# Patient Record
Sex: Male | Born: 1965 | Race: White | Hispanic: No | Marital: Married | Smoking: Never smoker
Health system: Southern US, Community
[De-identification: ages and names within clinical notes are randomized; demographics above are authoritative.]

## PROBLEM LIST (undated history)

## (undated) DIAGNOSIS — N189 Chronic kidney disease, unspecified: Secondary | ICD-10-CM

## (undated) DIAGNOSIS — R011 Cardiac murmur, unspecified: Secondary | ICD-10-CM

## (undated) DIAGNOSIS — Z87442 Personal history of urinary calculi: Secondary | ICD-10-CM

## (undated) DIAGNOSIS — S42309A Unspecified fracture of shaft of humerus, unspecified arm, initial encounter for closed fracture: Secondary | ICD-10-CM

## (undated) DIAGNOSIS — E785 Hyperlipidemia, unspecified: Secondary | ICD-10-CM

## (undated) DIAGNOSIS — T7840XA Allergy, unspecified, initial encounter: Secondary | ICD-10-CM

## (undated) HISTORY — PX: HERNIA REPAIR: SHX51

## (undated) HISTORY — DX: Hyperlipidemia, unspecified: E78.5

## (undated) HISTORY — DX: Cardiac murmur, unspecified: R01.1

## (undated) HISTORY — DX: Allergy, unspecified, initial encounter: T78.40XA

## (undated) HISTORY — DX: Unspecified fracture of shaft of humerus, unspecified arm, initial encounter for closed fracture: S42.309A

---

## 2010-07-25 ENCOUNTER — Emergency Department (HOSPITAL_COMMUNITY): Admission: EM | Admit: 2010-07-25 | Discharge: 2010-07-26 | Payer: Self-pay | Admitting: Emergency Medicine

## 2010-07-25 ENCOUNTER — Encounter: Admission: RE | Admit: 2010-07-25 | Discharge: 2010-07-25 | Payer: Self-pay | Admitting: Family Medicine

## 2011-02-06 LAB — URINALYSIS, ROUTINE W REFLEX MICROSCOPIC
Glucose, UA: NEGATIVE mg/dL
Leukocytes, UA: NEGATIVE
Nitrite: NEGATIVE
Specific Gravity, Urine: 1.021 (ref 1.005–1.030)
pH: 5.5 (ref 5.0–8.0)

## 2011-02-06 LAB — BASIC METABOLIC PANEL
BUN: 15 mg/dL (ref 6–23)
Calcium: 9.5 mg/dL (ref 8.4–10.5)
Creatinine, Ser: 0.94 mg/dL (ref 0.4–1.5)
GFR calc non Af Amer: >60 mL/min (ref >60)
Glucose, Bld: 104 mg/dL — ABNORMAL HIGH (ref 70–99)

## 2011-02-06 LAB — CBC
HCT: 43.6 % (ref 39.0–52.0)
MCHC: 34.6 g/dL (ref 30.0–36.0)
Platelets: 380 10*3/uL (ref 150–400)
RDW: 12.7 % (ref 11.5–15.5)
WBC: 8 10*3/uL (ref 4.0–10.5)

## 2011-02-06 LAB — URINE MICROSCOPIC-ADD ON

## 2012-01-31 ENCOUNTER — Ambulatory Visit (INDEPENDENT_AMBULATORY_CARE_PROVIDER_SITE_OTHER): Payer: Managed Care, Other (non HMO) | Admitting: Family Medicine

## 2012-01-31 DIAGNOSIS — N200 Calculus of kidney: Secondary | ICD-10-CM

## 2012-01-31 DIAGNOSIS — R319 Hematuria, unspecified: Secondary | ICD-10-CM

## 2012-01-31 LAB — POCT UA - MICROSCOPIC ONLY
Bacteria, U Microscopic: NEGATIVE
Casts, Ur, LPF, POC: NEGATIVE
Crystals, Ur, HPF, POC: NEGATIVE
Epithelial cells, urine per micros: NEGATIVE
Mucus, UA: NEGATIVE
Yeast, UA: NEGATIVE

## 2012-01-31 LAB — POCT URINALYSIS DIPSTICK
Bilirubin, UA: NEGATIVE
Glucose, UA: NEGATIVE
Ketones, UA: NEGATIVE
Nitrite, UA: NEGATIVE
Protein, UA: 30
Spec Grav, UA: 1.015
Urobilinogen, UA: 0.2
pH, UA: 5.5

## 2012-01-31 MED ORDER — TAMSULOSIN HCL 0.4 MG PO CAPS: 0.4000 mg | ORAL_CAPSULE | Freq: Every day | ORAL | Status: DC

## 2012-01-31 NOTE — Progress Notes (Signed)
  Subjective:    Patient ID: Victor Nixon, male    DOB: [birth year], 46 y.o.   MRN: [id number]  Abdominal Pain This is a chronic problem. The current episode started more than 1 month ago. The onset quality is gradual. The problem occurs intermittently. The problem has been unchanged. The pain is located in the left flank. The pain is at a severity of 3/10. The pain is mild. The quality of the pain is aching and a sensation of fullness. The abdominal pain does not radiate. The pain is aggravated by nothing. The pain is relieved by nothing. He has tried nothing for the symptoms. The treatment provided no relief.      Review of Systems  Gastrointestinal: Positive for abdominal pain.  All other systems reviewed and are negative.       Objective:   Physical Exam  Constitutional: He is oriented to person, place, and time. He appears well-developed and well-nourished. No distress.  HENT:  Head: Normocephalic and atraumatic.  Eyes: EOM are normal. Pupils are equal, round, and reactive to light.  Neck: Normal range of motion. Neck supple.  Cardiovascular: Normal rate and regular rhythm.   Pulmonary/Chest: Effort normal and breath sounds normal.  Abdominal: Soft.  Musculoskeletal: Normal range of motion.  Neurological: He is alert and oriented to person, place, and time.  Skin: Skin is warm and dry. He is not diaphoretic.  Psychiatric: He has a normal mood and affect. His behavior is normal. Judgment and thought content normal.   Results for orders placed in visit on 01/31/12  POCT URINALYSIS DIPSTICK      Component Value Range   Color, UA yellow     Clarity, UA clear     Glucose, UA negative     Bilirubin, UA negative     Ketones, UA negative     Spec Grav, UA 1.015     Blood, UA large     pH, UA 5.5     Protein, UA 30     Urobilinogen, UA 0.2     Nitrite, UA negative     Leukocytes, UA Trace    POCT UA - MICROSCOPIC ONLY      Component Value Range   WBC, Ur, HPF, POC 1-4       RBC, urine, microscopic TNTC     Bacteria, U Microscopic negative     Mucus, UA negative     Epithelial cells, urine per micros negative     Crystals, Ur, HPF, POC negative     Casts, Ur, LPF, POC negative     Yeast, UA negative            Assessment & Plan:  Kidney stone  P:  Drink plenty, Flomax 0.4 daily, call INB in 48 hrs.

## 2012-01-31 NOTE — Patient Instructions (Signed)

## 2012-05-12 ENCOUNTER — Ambulatory Visit (INDEPENDENT_AMBULATORY_CARE_PROVIDER_SITE_OTHER): Payer: Managed Care, Other (non HMO) | Admitting: Family Medicine

## 2012-05-12 VITALS — BP 110/71 | HR 67 | Temp 97.9°F | Resp 16 | Ht 70.0 in | Wt 179.6 lb

## 2012-05-12 DIAGNOSIS — R109 Unspecified abdominal pain: Secondary | ICD-10-CM

## 2012-05-12 LAB — POCT URINALYSIS DIPSTICK
Glucose, UA: NEGATIVE
Ketones, UA: NEGATIVE
Spec Grav, UA: 1.01
Urobilinogen, UA: 0.2

## 2012-05-12 LAB — POCT CBC
Hemoglobin: 15.6 g/dL (ref 14.1–18.1)
Lymph, poc: 1.9 (ref 0.6–3.4)
MCH, POC: 30.6 pg (ref 27–31.2)
MCV: 90.4 fL (ref 80–97)
MID (cbc): 0.5 (ref 0–0.9)
Platelet Count, POC: 450 10*3/uL — AB (ref 142–424)
RBC: 5.09 M/uL (ref 4.69–6.13)
WBC: 5.8 10*3/uL (ref 4.6–10.2)

## 2012-05-12 LAB — COMPREHENSIVE METABOLIC PANEL
BUN: 13 mg/dL (ref 6–23)
CO2: 26 mEq/L (ref 19–32)
Calcium: 9.8 mg/dL (ref 8.4–10.5)
Chloride: 101 mEq/L (ref 96–112)
Creat: 0.83 mg/dL (ref 0.50–1.35)

## 2012-05-12 LAB — POCT UA - MICROSCOPIC ONLY: Mucus, UA: NEGATIVE

## 2012-05-12 NOTE — Progress Notes (Signed)
Patient Name: Victor Nixon Date of Birth: Jun 20, 1966 Medical Record Number: [id number] Gender: male Date of Encounter: 05/12/2012  History of Present Illness:  Victor Nixon is a 46 y.o. very pleasant male patient who presents with the following:  He has had problems  LLQ abdominal pain since late 2012.  He was here in march and diagnosed with possible kidney stone- treated with flomax.  The flomax did help, but he has continued to have fluctuating pain which is sometimes an "ache" and sometimes can be more painful.  It does not hurt every day.   No hematuria, no BRBPR or melena.  No weight loss, no nausea or vomiting.   He does not feel that his urine flows as well as it should.    There is no problem list on file for this patient.  No past medical history on file. No past surgical history on file. History  Substance Use Topics  . Smoking status: Never Smoker   . Smokeless tobacco: Not on file  . Alcohol Use: Not on file   No family history on file. Allergies  Allergen Reactions  . Cephalexin     Medication list has been reviewed and updated.  Prior to Admission medications   Medication Sig Start Date End Date Taking? Authorizing Provider  Tamsulosin HCl (FLOMAX) 0.4 MG CAPS Take 1 capsule (0.4 mg total) by mouth daily. 01/31/12  Yes Elvina Sidle, MD    Review of Systems:  As per HPI- otherwise negative.   Physical Examination: Filed Vitals:   05/12/12 0745  BP: 110/71  Pulse: 67  Temp: 97.9 F (36.6 C)  Resp: 16   Filed Vitals:   05/12/12 0745  Height: 5\' 10"  (1.778 m)  Weight: 179 lb 9.6 oz (81.466 kg)   Body mass index is 25.77 kg/(m^2).Ideal Body Weight: Weight in (lb) to have BMI = 25: 173.9   GEN: WDWN, NAD, Non-toxic, A & O x 3 HEENT: Atraumatic, Normocephalic. Neck supple. No masses, No LAD. Ears and Nose: No external deformity. CV: RRR, No M/G/R. No JVD. No thrill. No extra heart sounds. PULM: CTA B, no wheezes, crackles, rhonchi. No  retractions. No resp. distress. No accessory muscle use. ABD: S, ND, +BS. No rebound. No HSM.  Minimal tenderness LLQ GU: no sign of inguinal hernia, normal genitalia, no testicular masses EXTR: No c/c/e NEURO Normal gait.  PSYCH: Normally interactive. Conversant. Not depressed or anxious appearing.  Calm demeanor.   Results for orders placed in visit on 05/12/12  POCT CBC      Component Value Range   WBC 5.8  4.6 - 10.2 K/uL   Lymph, poc 1.9  0.6 - 3.4   POC LYMPH PERCENT 33.0  10 - 50 %L   MID (cbc) 0.5  0 - 0.9   POC MID % 8.6  0 - 12 %M   POC Granulocyte 3.4  2 - 6.9   Granulocyte percent 58.4  37 - 80 %G   RBC 5.09  4.69 - 6.13 M/uL   Hemoglobin 15.6  14.1 - 18.1 g/dL   HCT, POC 78.2  95.6 - 53.7 %   MCV 90.4  80 - 97 fL   MCH, POC 30.6  27 - 31.2 pg   MCHC 33.9  31.8 - 35.4 g/dL   RDW, POC 21.3     Platelet Count, POC 450 (*) 142 - 424 K/uL   MPV 8.7  0 - 99.8 fL  POCT UA - MICROSCOPIC ONLY  Component Value Range   WBC, Ur, HPF, POC 6-8     RBC, urine, microscopic 0-1     Bacteria, U Microscopic trace     Mucus, UA negative     Epithelial cells, urine per micros 0-1     Crystals, Ur, HPF, POC negative     Casts, Ur, LPF, POC negative     Yeast, UA negative     Renal tubular cells positive    POCT URINALYSIS DIPSTICK      Component Value Range   Color, UA yellow     Clarity, UA clear     Glucose, UA negative     Bilirubin, UA negative     Ketones, UA negative     Spec Grav, UA 1.010     Blood, UA trace-intact     pH, UA 6.5     Protein, UA negative     Urobilinogen, UA 0.2     Nitrite, UA negative     Leukocytes, UA small (1+)      Assessment and Plan: 1. Abdominal pain  POCT CBC, Comprehensive metabolic panel, POCT UA - Microscopic Only, POCT urinalysis dipstick, US Abdomen Complete, Urine culture   Chronic abdominal pain of some months duration.  Await other labs and set up abdominal ultrasound.  Ade is hesitant to do a CT, but is willing to go  ahead with U/S which is a reasonable first step.  Will check urine culture due to wbc in urine.  Otherwise POC labs are reassuring.  He will let me know if he gets worse or if symptoms change in the mantime- otherwise follow- up pending his labs and ultrasound.    Abbe Amsterdam, MD

## 2012-05-13 ENCOUNTER — Encounter: Payer: Self-pay | Admitting: Family Medicine

## 2012-05-14 LAB — URINE CULTURE
Colony Count: NO GROWTH
Organism ID, Bacteria: NO GROWTH

## 2012-05-17 ENCOUNTER — Ambulatory Visit
Admission: RE | Admit: 2012-05-17 | Discharge: 2012-05-17 | Disposition: A | Discharge status: Home / Self Care | Payer: Managed Care, Other (non HMO) | Source: Ambulatory Visit | Attending: Family Medicine | Admitting: Family Medicine

## 2012-05-17 ENCOUNTER — Other Ambulatory Visit: Payer: Self-pay | Admitting: Family Medicine

## 2012-05-17 DIAGNOSIS — N1330 Unspecified hydronephrosis: Secondary | ICD-10-CM

## 2012-05-17 DIAGNOSIS — R109 Unspecified abdominal pain: Secondary | ICD-10-CM

## 2012-05-18 ENCOUNTER — Telehealth: Payer: Self-pay

## 2012-05-18 NOTE — Telephone Encounter (Signed)
Patient called wanting some clarity on his CT orders. States that Copland ordered it w/o contrast but when he made his appt he was told that it would be with contrast. His appt is at 10:45 am on 05/19/12 and he does not want to drink that "nasty stuff" if he doesn't have to. Please clarify and/or correct with [facility].

## 2012-05-19 ENCOUNTER — Telehealth: Payer: Self-pay

## 2012-05-19 ENCOUNTER — Ambulatory Visit
Admission: RE | Admit: 2012-05-19 | Discharge: 2012-05-19 | Disposition: A | Discharge status: Home / Self Care | Payer: Managed Care, Other (non HMO) | Source: Ambulatory Visit | Attending: Family Medicine | Admitting: Family Medicine

## 2012-05-19 DIAGNOSIS — N1330 Unspecified hydronephrosis: Secondary | ICD-10-CM

## 2012-05-19 NOTE — Telephone Encounter (Signed)
Urgent  DR.  COPLAND   Pt was told he needed to drink the contrast before his 11 am appt.  He said you did not want the contrast.  Please call [phone]

## 2012-05-19 NOTE — Telephone Encounter (Signed)
See notes under 6/26 phone message.

## 2012-05-19 NOTE — Telephone Encounter (Signed)
Pt is calling he just had a ct done and would like for someone to contact him when the results are in.

## 2012-05-19 NOTE — Telephone Encounter (Signed)
Spoke w/pt and let him know that (per Tamara's report) Dr Patsy Lager called [facility] this AM and instr'd them that she wants CT w/o contrast. [facility] stated that we had to put the referral order back in and state in the order that the test is only to r/o kidney stones. Delaney Meigs put in order this AM again w/this add'l note. Advised pt that [facility] should be able to see the new order in their computer but if he has any trouble when he arrives to have them call us. Pt agreed and thanked [place].

## 2012-05-19 NOTE — Addendum Note (Signed)
Addended by: Cydney Ok on: 05/19/2012 08:50 AM   Modules accepted: Orders

## 2012-05-19 NOTE — Telephone Encounter (Signed)
Done- we are setting him up to see urology

## 2012-05-20 ENCOUNTER — Other Ambulatory Visit: Payer: Self-pay | Admitting: Urology

## 2012-05-20 ENCOUNTER — Telehealth: Payer: Self-pay

## 2012-05-20 DIAGNOSIS — N200 Calculus of kidney: Secondary | ICD-10-CM

## 2012-05-20 NOTE — Telephone Encounter (Signed)
Dr  Patsy Lager   PT HAS VISITED [facility] AND NOW HAS SOME QUESTIONS FOR [name] MAY REACH PT AT [phone]

## 2012-05-21 NOTE — Telephone Encounter (Signed)
Called and LMOM- I would defer to the urologist's advice concerning stone management.  Shock wave therapy is only appropriate for some stones.  If I can be of further help please call, but my best advice would be to follow the urologist's recommendation.

## 2012-05-21 NOTE — Telephone Encounter (Signed)
CB pt and explained that Dr Patsy Lager is out of the office until 05/31/12 and asked if another provider can help him w/his ?s. Pt stated there is no real urgency, but would like to talk w/Dr Copland after her return if possible (he will be OOT between 6/7-6/15), and if another provider has any input bf that he would be glad to have that info. Urologist recommended a procedure where they insert a tube and go into kidney to retrieve the large kidney stone he has instead of the procedure that uses sound waves d/t size of stone. Pt just wants your input on the procedure, etc.

## 2012-06-09 ENCOUNTER — Encounter (HOSPITAL_COMMUNITY): Payer: Self-pay | Admitting: Pharmacy Technician

## 2012-06-15 ENCOUNTER — Encounter (HOSPITAL_COMMUNITY)
Admission: RE | Admit: 2012-06-15 | Discharge: 2012-06-15 | Disposition: A | Discharge status: Home / Self Care | Payer: Managed Care, Other (non HMO) | Source: Ambulatory Visit | Attending: Urology | Admitting: Urology

## 2012-06-15 ENCOUNTER — Encounter (HOSPITAL_COMMUNITY): Payer: Self-pay

## 2012-06-15 HISTORY — DX: Chronic kidney disease, unspecified: N18.9

## 2012-06-15 LAB — CBC
HCT: 42.3 % (ref 39.0–52.0)
Hemoglobin: 14.5 g/dL (ref 13.0–17.0)
MCH: 30.1 pg (ref 26.0–34.0)
MCHC: 34.3 g/dL (ref 30.0–36.0)
MCV: 87.9 fL (ref 78.0–100.0)
Platelets: 379 10*3/uL (ref 150–400)
RBC: 4.81 MIL/uL (ref 4.22–5.81)
RDW: 12.9 % (ref 11.5–15.5)
WBC: 5.9 10*3/uL (ref 4.0–10.5)

## 2012-06-15 LAB — SURGICAL PCR SCREEN
MRSA, PCR: NEGATIVE
Staphylococcus aureus: POSITIVE — AB

## 2012-06-15 NOTE — Patient Instructions (Signed)
20 Victor Nixon  06/15/2012   Your procedure is scheduled on:  Monday 06/21/2012 at 1030 am for surgery  Report to [facility] Radiology  at [facility] AM.  Call this number if you have problems the morning of surgery: [phone]   Remember:   Do not eat food:After Midnight.  May have clear liquids:until Midnight .  Marland Kitchen  Take these medicines the morning of surgery with A SIP OF WATER:NONE                         Do not wear jewlery  Do not wear lotions, powders, or perfumes.  Do not shave 48 hours prior to surgery. Men may shave face and neck.  Do not bring valuables to the hospital.  Contacts, dentures or bridgework may not be worn into surgery.  Leave suitcase in the car. After surgery it may be brought to your room.  For patients admitted to the hospital, checkout time is 11:00 AM the day of discharge.       Special Instructions: CHG Shower Use Special Wash: 1/2 bottle night before surgery and 1/2 bottle morning of surgery.   Please read over the following fact sheets that you were given: MRSA Information, Blood Transfusion sheet, Incentive Spirometry Sheet, Sleep apnea sheet                If you have any questions, please call me ar [phone] Telford Nab.Georgeanna Lea, Rn,BSN

## 2012-06-15 NOTE — Pre-Procedure Instructions (Signed)
Reviewed with patient pre-op instructions using the Teach back method.

## 2012-06-17 ENCOUNTER — Other Ambulatory Visit: Payer: Self-pay | Admitting: Radiology

## 2012-06-18 ENCOUNTER — Other Ambulatory Visit: Payer: Self-pay | Admitting: Family Medicine

## 2012-06-20 NOTE — H&P (Signed)
History of Present Illness        Left nephrolithiasis: The patient underwent a renal ultrasound on 05/17/12 for left lower quadrant pain and was found to have left-sided hydronephrosis. A followup CT scan done 2 days later revealed a 15 mm stone in the left renal pelvis with mild hydronephrosis noted. The stone was actually located at the ureteropelvic junction and I measured Hounsfield units of 1250. No right renal calculi were identified. I also reviewed a CT scan done on 07/25/10 which revealed a stone located at the same position measuring 5 mm in size and also causing mild dilatation of the collecting system. He thought he passed his stone then except what he describes sounds like it may have just been a small clot. A urine culture was found to be negative and a urinalysis prior to that was clear. The creatinine was noted to be normal at 0.83. He's been taking tamsulosin which he finds actually does help relieve some of the bloating type sensation that he has experienced.   Past Medical History Problems  1. History of  Hypercholesterolemia 272.0 2. History of  Murmurs 785.2  Surgical History Problems  1. History of  Hernia Repair  Current Meds 1. Fish Oil 1000 MG Oral Capsule; Therapy: (Recorded:27Jun2013) to 2. Flomax 0.4 MG Oral Capsule; Therapy: (Recorded:27Jun2013) to  Allergies Medication  1. Keflex TABS  Family History Problems  1. Family history of  No Significant Family History  Social History Problems    Alcohol Use   Caffeine Use   Marital History - Currently Married   Never A Smoker  Review of Systems Genitourinary, constitutional, skin, eye, otolaryngeal, hematologic/lymphatic, cardiovascular, pulmonary, endocrine, musculoskeletal, gastrointestinal, neurological and psychiatric system(s) were reviewed and pertinent findings if present are noted.  Gastrointestinal: abdominal pain.    Vitals Vital Signs  BMI Calculated: 25.77 BSA Calculated: 2 Height: 5 ft  10 in Weight: 180 lb  Blood Pressure: 102 / 64 Heart Rate: 64  Physical Exam Constitutional: Well nourished and well developed . No acute distress.  ENT:. The ears and nose are normal in appearance.  Neck: The appearance of the neck is normal and no neck mass is present.  Pulmonary: No respiratory distress and normal respiratory rhythm and effort.  Cardiovascular: Heart rate and rhythm are normal . No peripheral edema.  Abdomen: The abdomen is soft and nontender. No masses are palpated. No CVA tenderness. No hernias are palpable. No hepatosplenomegaly noted.  Lymphatics: The femoral and inguinal nodes are not enlarged or tender.  Skin: Normal skin turgor, no visible rash and no visible skin lesions.  Neuro/Psych:. Mood and affect are appropriate.    Results/Data Urine COLOR STRAW   APPEARANCE CLEAR   SPECIFIC GRAVITY 1.010   pH 5.0   GLUCOSE NEG mg/dL  BILIRUBIN NEG   KETONE NEG mg/dL  BLOOD NEG   PROTEIN NEG mg/dL  UROBILINOGEN 0.2 mg/dL  NITRITE NEG   LEUKOCYTE ESTERASE TRACE   SQUAMOUS EPITHELIAL/HPF NONE SEEN   WBC 0-2 WBC/hpf  RBC NONE SEEN RBC/hpf  BACTERIA NONE SEEN   CRYSTALS NONE SEEN   CASTS NONE SEEN    The following images/tracing/specimen were independently visualized:  CT scan as above.  The following clinical lab reports were reviewed:  Urinalysis and creatinine as above.  The following radiology reports were reviewed: CT scan.    Assessment Assessed  1. Nephrolithiasis Of The Left Kidney 592.0 2. Hydronephrosis On The Left 591   I have discussed with the patient  the fact that he has a large stone located ureteropelvic junction on the left-hand side with very high Hounsfield units. We discussed the treatment options and certainly due to the size of the stone he would not be a candidate for ureteroscopic management. Lithotripsy could be performed with the placement of a stent prior to the procedure however due to the stone size and density this would likely  require more than one treatment with lithotripsy. I therefore have recommended a percutaneous nephrolithotomy. I went over this procedure with him in detail and drew pictures to explain how the procedure was performed, we then discussed its risks and complications and probability of success as well as possible need for stent and nephrostomy tube postoperatively. He understands and has elected to proceed.   Plan    He is scheduled for a left percutaneous nephrolithotomy.

## 2012-06-20 NOTE — Anesthesia Preprocedure Evaluation (Addendum)
Anesthesia Evaluation  Patient identified by MRN, date of birth, ID band Patient awake    Reviewed: Allergy & Precautions, H&P , NPO status , Patient's Chart, lab work & pertinent test results  Airway Mallampati: II TM Distance: >3 FB Neck ROM: full    Dental No notable dental hx.    Pulmonary neg pulmonary ROS,  breath sounds clear to auscultation  Pulmonary exam normal       Cardiovascular Exercise Tolerance: Good negative cardio ROS  Rhythm:regular Rate:Normal     Neuro/Psych negative neurological ROS  negative psych ROS   GI/Hepatic negative GI ROS, Neg liver ROS,   Endo/Other  negative endocrine ROS  Renal/GU negative Renal ROS  negative genitourinary   Musculoskeletal   Abdominal   Peds  Hematology negative hematology ROS (+)   Anesthesia Other Findings   Reproductive/Obstetrics negative OB ROS                           Anesthesia Physical Anesthesia Plan  ASA: I  Anesthesia Plan: General   Post-op Pain Management:    Induction: Intravenous  Airway Management Planned: Oral ETT  Additional Equipment:   Intra-op Plan:   Post-operative Plan: Extubation in OR  Informed Consent: I have reviewed the patients History and Physical, chart, labs and discussed the procedure including the risks, benefits and alternatives for the proposed anesthesia with the patient or authorized representative who has indicated his/her understanding and acceptance.   Dental Advisory Given  Plan Discussed with: CRNA and Surgeon  Anesthesia Plan Comments:         Anesthesia Quick Evaluation  

## 2012-06-21 ENCOUNTER — Ambulatory Visit (HOSPITAL_COMMUNITY)
Admission: RE | Admit: 2012-06-21 | Discharge: 2012-06-22 | Disposition: A | Discharge status: Home / Self Care | Payer: Managed Care, Other (non HMO) | Source: Ambulatory Visit | Attending: Urology | Admitting: Urology

## 2012-06-21 ENCOUNTER — Ambulatory Visit (HOSPITAL_COMMUNITY): Payer: Managed Care, Other (non HMO) | Admitting: Anesthesiology

## 2012-06-21 ENCOUNTER — Encounter (HOSPITAL_COMMUNITY)
Admission: RE | Disposition: A | Discharge status: Home / Self Care | Payer: Self-pay | Source: Ambulatory Visit | Attending: Urology

## 2012-06-21 ENCOUNTER — Encounter (HOSPITAL_COMMUNITY): Payer: Self-pay | Admitting: Anesthesiology

## 2012-06-21 ENCOUNTER — Ambulatory Visit (HOSPITAL_COMMUNITY)
Admission: RE | Admit: 2012-06-21 | Discharge: 2012-06-21 | Disposition: A | Discharge status: Home / Self Care | Payer: Managed Care, Other (non HMO) | Source: Ambulatory Visit | Attending: Urology | Admitting: Urology

## 2012-06-21 ENCOUNTER — Other Ambulatory Visit: Payer: Self-pay | Admitting: Urology

## 2012-06-21 ENCOUNTER — Encounter (HOSPITAL_COMMUNITY): Payer: Self-pay

## 2012-06-21 ENCOUNTER — Ambulatory Visit (HOSPITAL_COMMUNITY): Payer: Managed Care, Other (non HMO)

## 2012-06-21 VITALS — BP 127/86 | HR 81 | Temp 98.5°F | Resp 16 | Ht 70.0 in | Wt 185.0 lb

## 2012-06-21 DIAGNOSIS — Z01812 Encounter for preprocedural laboratory examination: Secondary | ICD-10-CM | POA: Insufficient documentation

## 2012-06-21 DIAGNOSIS — E7800 Pure hypercholesterolemia, unspecified: Secondary | ICD-10-CM | POA: Insufficient documentation

## 2012-06-21 DIAGNOSIS — N200 Calculus of kidney: Secondary | ICD-10-CM

## 2012-06-21 DIAGNOSIS — N1330 Unspecified hydronephrosis: Secondary | ICD-10-CM | POA: Insufficient documentation

## 2012-06-21 DIAGNOSIS — Z79899 Other long term (current) drug therapy: Secondary | ICD-10-CM | POA: Insufficient documentation

## 2012-06-21 DIAGNOSIS — R011 Cardiac murmur, unspecified: Secondary | ICD-10-CM | POA: Insufficient documentation

## 2012-06-21 HISTORY — PX: NEPHROLITHOTOMY: SHX5134

## 2012-06-21 LAB — CBC WITH DIFFERENTIAL/PLATELET
Basophils Absolute: 0.1 10*3/uL (ref 0.0–0.1)
Basophils Relative: 1 % (ref 0–1)
Eosinophils Relative: 3 % (ref 0–5)
HCT: 44.1 % (ref 39.0–52.0)
MCH: 30.6 pg (ref 26.0–34.0)
MCHC: 34.9 g/dL (ref 30.0–36.0)
MCV: 87.5 fL (ref 78.0–100.0)
Monocytes Absolute: 0.7 10*3/uL (ref 0.1–1.0)
RDW: 12.7 % (ref 11.5–15.5)

## 2012-06-21 LAB — ABO/RH: ABO/RH(D): O POS

## 2012-06-21 LAB — TYPE AND SCREEN
ABO/RH(D): O POS
Antibody Screen: NEGATIVE

## 2012-06-21 LAB — BASIC METABOLIC PANEL
BUN: 12 mg/dL (ref 6–23)
CO2: 27 mEq/L (ref 19–32)
Chloride: 100 mEq/L (ref 96–112)
Creatinine, Ser: 0.84 mg/dL (ref 0.50–1.35)

## 2012-06-21 SURGERY — NEPHROLITHOTOMY PERCUTANEOUS
Anesthesia: General | Laterality: Left | Wound class: Clean Contaminated

## 2012-06-21 MED ORDER — IOHEXOL 300 MG/ML  SOLN: 30.0000 mL | Freq: Once | INTRAMUSCULAR | Status: DC | PRN

## 2012-06-21 MED ORDER — IOHEXOL 300 MG/ML  SOLN
INTRAMUSCULAR | Status: DC | PRN
  Administered 2012-06-21: 10 mL

## 2012-06-21 MED ORDER — CIPROFLOXACIN IN D5W 400 MG/200ML IV SOLN: 400.0000 mg | Freq: Once | INTRAVENOUS | Status: DC

## 2012-06-21 MED ORDER — ONDANSETRON HCL 4 MG/2ML IJ SOLN: 4.0000 mg | INTRAMUSCULAR | Status: DC | PRN

## 2012-06-21 MED ORDER — MIDAZOLAM HCL 5 MG/5ML IJ SOLN
INTRAMUSCULAR | Status: AC | PRN
  Administered 2012-06-21 (×5): 1 mg via INTRAVENOUS

## 2012-06-21 MED ORDER — FENTANYL CITRATE 0.05 MG/ML IJ SOLN
INTRAMUSCULAR | Status: AC
  Filled 2012-06-21: qty 4

## 2012-06-21 MED ORDER — HYOSCYAMINE SULFATE 0.125 MG SL SUBL
0.1250 mg | SUBLINGUAL_TABLET | SUBLINGUAL | Status: DC | PRN
  Filled 2012-06-21: qty 1

## 2012-06-21 MED ORDER — CISATRACURIUM BESYLATE (PF) 10 MG/5ML IV SOLN
INTRAVENOUS | Status: DC | PRN
  Administered 2012-06-21: 10 mg via INTRAVENOUS

## 2012-06-21 MED ORDER — LACTATED RINGERS IV SOLN: INTRAVENOUS | Status: DC

## 2012-06-21 MED ORDER — MEPERIDINE HCL 50 MG/ML IJ SOLN
INTRAMUSCULAR | Status: AC
  Filled 2012-06-21: qty 1

## 2012-06-21 MED ORDER — HYDROMORPHONE HCL PF 1 MG/ML IJ SOLN
INTRAMUSCULAR | Status: AC
  Filled 2012-06-21: qty 1

## 2012-06-21 MED ORDER — SODIUM CHLORIDE 0.9 % IV SOLN
Freq: Once | INTRAVENOUS | Status: AC
  Administered 2012-06-21: 09:00:00 via INTRAVENOUS

## 2012-06-21 MED ORDER — ZOLPIDEM TARTRATE 5 MG PO TABS: 5.0000 mg | ORAL_TABLET | Freq: Every evening | ORAL | Status: DC | PRN

## 2012-06-21 MED ORDER — FENTANYL CITRATE 0.05 MG/ML IJ SOLN
INTRAMUSCULAR | Status: AC
  Filled 2012-06-21: qty 2

## 2012-06-21 MED ORDER — SODIUM CHLORIDE 0.9 % IV SOLN
INTRAVENOUS | Status: DC
  Administered 2012-06-21: 14:00:00 via INTRAVENOUS

## 2012-06-21 MED ORDER — PROPOFOL 10 MG/ML IV EMUL
INTRAVENOUS | Status: DC | PRN
  Administered 2012-06-21: 150 mg via INTRAVENOUS

## 2012-06-21 MED ORDER — ACETAMINOPHEN 10 MG/ML IV SOLN
1000.0000 mg | Freq: Four times a day (QID) | INTRAVENOUS | Status: AC
  Administered 2012-06-21 – 2012-06-22 (×3): 1000 mg via INTRAVENOUS
  Filled 2012-06-21 (×3): qty 100

## 2012-06-21 MED ORDER — GLYCOPYRROLATE 0.2 MG/ML IJ SOLN
INTRAMUSCULAR | Status: DC | PRN
  Administered 2012-06-21: .8 mg via INTRAVENOUS

## 2012-06-21 MED ORDER — MIDAZOLAM HCL 2 MG/2ML IJ SOLN
INTRAMUSCULAR | Status: AC
  Filled 2012-06-21: qty 2

## 2012-06-21 MED ORDER — HYDROMORPHONE HCL PF 1 MG/ML IJ SOLN: 0.5000 mg | INTRAMUSCULAR | Status: DC | PRN

## 2012-06-21 MED ORDER — MEPERIDINE HCL 50 MG/ML IJ SOLN
12.5000 mg | INTRAMUSCULAR | Status: AC | PRN
  Administered 2012-06-21 (×2): 12.5 mg via INTRAVENOUS

## 2012-06-21 MED ORDER — ONDANSETRON HCL 4 MG/2ML IJ SOLN
INTRAMUSCULAR | Status: DC | PRN
  Administered 2012-06-21: 4 mg via INTRAVENOUS

## 2012-06-21 MED ORDER — OXYCODONE-ACETAMINOPHEN 5-325 MG PO TABS
1.0000 | ORAL_TABLET | ORAL | Status: DC | PRN
  Administered 2012-06-22: 1 via ORAL
  Filled 2012-06-21: qty 1

## 2012-06-21 MED ORDER — SODIUM CHLORIDE 0.9 % IR SOLN
Status: DC | PRN
  Administered 2012-06-21: 6000 mL

## 2012-06-21 MED ORDER — DEXAMETHASONE SODIUM PHOSPHATE 10 MG/ML IJ SOLN
INTRAMUSCULAR | Status: DC | PRN
  Administered 2012-06-21: 10 mg via INTRAVENOUS

## 2012-06-21 MED ORDER — FENTANYL CITRATE 0.05 MG/ML IJ SOLN
INTRAMUSCULAR | Status: DC | PRN
  Administered 2012-06-21: 50 ug via INTRAVENOUS

## 2012-06-21 MED ORDER — NEOSTIGMINE METHYLSULFATE 1 MG/ML IJ SOLN
INTRAMUSCULAR | Status: DC | PRN
  Administered 2012-06-21: 5 mg via INTRAVENOUS

## 2012-06-21 MED ORDER — MIDAZOLAM HCL 2 MG/2ML IJ SOLN
INTRAMUSCULAR | Status: AC
  Filled 2012-06-21: qty 4

## 2012-06-21 MED ORDER — CIPROFLOXACIN IN D5W 400 MG/200ML IV SOLN
400.0000 mg | INTRAVENOUS | Status: AC
  Administered 2012-06-21: 400 mg via INTRAVENOUS
  Filled 2012-06-21: qty 200

## 2012-06-21 MED ORDER — HYDROMORPHONE HCL PF 1 MG/ML IJ SOLN
0.2500 mg | INTRAMUSCULAR | Status: DC | PRN
  Administered 2012-06-21 (×2): 0.5 mg via INTRAVENOUS

## 2012-06-21 MED ORDER — IOHEXOL 300 MG/ML  SOLN
INTRAMUSCULAR | Status: AC
  Filled 2012-06-21: qty 2

## 2012-06-21 MED ORDER — ACETAMINOPHEN 10 MG/ML IV SOLN
1000.0000 mg | Freq: Four times a day (QID) | INTRAVENOUS | Status: DC
  Administered 2012-06-21: 1000 mg via INTRAVENOUS
  Filled 2012-06-21 (×3): qty 100

## 2012-06-21 MED ORDER — PHENAZOPYRIDINE HCL 200 MG PO TABS
200.0000 mg | ORAL_TABLET | Freq: Three times a day (TID) | ORAL | Status: DC
  Administered 2012-06-21 – 2012-06-22 (×2): 200 mg via ORAL
  Filled 2012-06-21 (×5): qty 1

## 2012-06-21 MED ORDER — LACTATED RINGERS IV SOLN
INTRAVENOUS | Status: DC | PRN
  Administered 2012-06-21 (×2): via INTRAVENOUS

## 2012-06-21 MED ORDER — FENTANYL CITRATE 0.05 MG/ML IJ SOLN
INTRAMUSCULAR | Status: AC | PRN
  Administered 2012-06-21 (×2): 50 ug via INTRAVENOUS
  Administered 2012-06-21: 100 ug via INTRAVENOUS
  Administered 2012-06-21: 50 ug via INTRAVENOUS

## 2012-06-21 MED ORDER — CIPROFLOXACIN IN D5W 200 MG/100ML IV SOLN
200.0000 mg | Freq: Two times a day (BID) | INTRAVENOUS | Status: DC
  Administered 2012-06-21 – 2012-06-22 (×2): 200 mg via INTRAVENOUS
  Filled 2012-06-21 (×4): qty 100

## 2012-06-21 MED ORDER — LIDOCAINE HCL 1 % IJ SOLN
INTRAMUSCULAR | Status: AC
  Filled 2012-06-21: qty 20

## 2012-06-21 SURGICAL SUPPLY — 48 items
APPLICATOR SURGIFLO ENDO (HEMOSTASIS) ×2 IMPLANT
BAG URINE DRAINAGE (UROLOGICAL SUPPLIES) IMPLANT
BASKET ZERO TIP NITINOL 2.4FR (BASKET) IMPLANT
BENZOIN TINCTURE PRP APPL 2/3 (GAUZE/BANDAGES/DRESSINGS) ×2 IMPLANT
BLADE SURG 15 STRL LF DISP TIS (BLADE) ×1 IMPLANT
BLADE SURG 15 STRL SS (BLADE) ×1
CATH FOLEY 2W COUNCIL 20FR 5CC (CATHETERS) IMPLANT
CATH FOLEY 2WAY SLVR  5CC 18FR (CATHETERS) ×1
CATH FOLEY 2WAY SLVR 5CC 18FR (CATHETERS) ×1 IMPLANT
CATH ROBINSON RED A/P 20FR (CATHETERS) IMPLANT
CATH X-FORCE N30 NEPHROSTOMY (TUBING) ×2 IMPLANT
CLOTH BEACON ORANGE TIMEOUT ST (SAFETY) ×2 IMPLANT
COVER SURGICAL LIGHT HANDLE (MISCELLANEOUS) ×2 IMPLANT
DRAPE C-ARM 42X72 X-RAY (DRAPES) ×2 IMPLANT
DRAPE CAMERA CLOSED 9X96 (DRAPES) IMPLANT
DRAPE LINGEMAN PERC (DRAPES) ×2 IMPLANT
DRAPE SURG IRRIG POUCH 19X23 (DRAPES) ×2 IMPLANT
DRSG PAD ABDOMINAL 8X10 ST (GAUZE/BANDAGES/DRESSINGS) ×4 IMPLANT
DRSG TEGADERM 8X12 (GAUZE/BANDAGES/DRESSINGS) ×4 IMPLANT
FLOSEAL 10ML (HEMOSTASIS) ×2 IMPLANT
GAUZE SPONGE 4X4 16PLY XRAY LF (GAUZE/BANDAGES/DRESSINGS) IMPLANT
GLOVE BIOGEL M 8.0 STRL (GLOVE) ×6 IMPLANT
GOWN STRL REIN XL XLG (GOWN DISPOSABLE) ×6 IMPLANT
GUIDEWIRE STR DUAL SENSOR (WIRE) IMPLANT
HOLDER FOLEY CATH W/STRAP (MISCELLANEOUS) IMPLANT
JUMPSUIT BLUE BOOT COVER DISP (PROTECTIVE WEAR) ×4 IMPLANT
KIT BASIN OR (CUSTOM PROCEDURE TRAY) ×2 IMPLANT
LASER FIBER DISP (UROLOGICAL SUPPLIES) IMPLANT
LASER FIBER DISP 1000U (UROLOGICAL SUPPLIES) IMPLANT
MANIFOLD NEPTUNE II (INSTRUMENTS) ×2 IMPLANT
NS IRRIG 1000ML POUR BTL (IV SOLUTION) ×2 IMPLANT
PACK BASIC VI WITH GOWN DISP (CUSTOM PROCEDURE TRAY) ×2 IMPLANT
PROBE LITHOCLAST ULTRA 3.8X403 (UROLOGICAL SUPPLIES) ×2 IMPLANT
PROBE PNEUMATIC 1.0MMX570MM (UROLOGICAL SUPPLIES) IMPLANT
SET IRRIG Y TYPE TUR BLADDER L (SET/KITS/TRAYS/PACK) ×2 IMPLANT
SET WARMING FLUID IRRIGATION (MISCELLANEOUS) IMPLANT
SPONGE GAUZE 4X4 12PLY (GAUZE/BANDAGES/DRESSINGS) IMPLANT
SPONGE LAP 4X18 X RAY DECT (DISPOSABLE) ×2 IMPLANT
STENT CONTOUR 6FRX24X.038 (STENTS) ×2 IMPLANT
STONE CATCHER W/TUBE ADAPTER (UROLOGICAL SUPPLIES) ×2 IMPLANT
SUT MNCRL AB 4-0 PS2 18 (SUTURE) ×2 IMPLANT
SUT SILK 2 0 30  PSL (SUTURE) ×1
SUT SILK 2 0 30 PSL (SUTURE) ×1 IMPLANT
SYR 20CC LL (SYRINGE) ×4 IMPLANT
SYRINGE 10CC LL (SYRINGE) ×2 IMPLANT
TOWEL OR NON WOVEN STRL DISP B (DISPOSABLE) ×2 IMPLANT
TRAY FOLEY CATH 14FRSI W/METER (CATHETERS) ×2 IMPLANT
TUBING CONNECTING 10 (TUBING) ×4 IMPLANT

## 2012-06-21 NOTE — Op Note (Signed)
PATIENT:  Victor Nixon  PRE-OPERATIVE DIAGNOSIS:  Left renal calculus.  POST-OPERATIVE DIAGNOSIS:  Same  PROCEDURE:  Procedure(s): 1. Right percutaneous nephrolithotomy (1.5 cm) 2. Antegrade right double-J stent placement  SURGEON:  [name]  INDICATION: Victor Nixon is a 46 year old male who was found to have left hydronephrosis on ultrasound done for left lower quadrant pain. A CT scan was done which revealed a 15 mm stone located at the ureteropelvic junction with mild hydronephrosis associated. He had Hounsfield units of 1250. I note the same stone was present on CT scan done in 9/11 at that time it measured 5 mm in size. We discussed the treatment options and due to the large size of the stone as well as the fact that it had been present for several years I recommended a percutaneous approach.  ANESTHESIA:  General  EBL:  Minimal  DRAINS: 6 French, 24 cm double-J stent in the left ureter  LOCAL MEDICATIONS USED:  None  SPECIMEN:  Stone  DISPOSITION OF SPECIMEN:  Given the patient  Description of procedure: After informed consent the patient was taken to the major OR. General anesthesia was and administered and he was moved to the prone position. His left flank including the previously placed nephrostomy catheter were sterilely prepped and draped. An official timeout was then performed.  I passed a 0.038 inch floppy-tipped guidewire through the previously placed nephrostomy catheter down into the bladder and remove the catheter. I then made a transverse incision over the guidewire and passed the coaxial catheter over the guidewire down the ureter under fluoroscopic control. A second guidewire was then passed through the inner portion of the coaxial system down into the bladder and used as a working guidewire with the second guidewire used as a safety guidewire. The UroMax fascial dilating balloon was then passed over the guidewire and into the area of the renal pelvis which was  identified by the location of the stone and his previous nephrostogram images. The balloon was dilated and the 30 French access sheath was passed over the balloon into the area of the renal pelvis. The balloon was then deflated and removed.  The 10 French rigid nephroscope was then passed under direct vision into the area the renal pelvis. The stone was easily identified at the ureteropelvic junction. I first grasped with 2 prong graspers and was able to gently worked the stone free from the ureteropelvic junction. I was unable to withdraw the stone through the sheath and therefore elected to proceed with ultrasonic fragmentation. The ultrasound probe was then passed through the rigid nephroscope and the stone was fragmented. I grasped the fragments with the 2 prong grasper and remove these. Reinspection revealed no further stone fragments within the kidney. The renal pelvis and UPJ region were noted to be intact. There was very little bleeding seen at any time during the case and as I backed the access sheath back to the level of the renal parenchyma no increase in bleeding was noted. I therefore elected to proceed with with a "tubeless" approach.  I backloaded the nephroscope over the guidewire and passed the double-J stent down the guidewire and into the bladder. As I removed the guidewire good curl was noted in the bladder as well as renal pelvis. I then grasped the stent with 2 prong graspers and held in position while the safety guidewire was removed. I then measured the distance from the edge of the renal parenchyma to the top of the access sheath and used  FloSeal to fill the nephrostomy tract by passing a laparoscopic introducer through the access sheath the measured distance to place it at the edge of the renal parenchyma and then began injecting as I withdrew the nephrostomy sheath. I then closed the skin with running subcuticular 4-0 Monocryl suture. A sterile 4 x 4 and OpSite dressing were then  applied and the patient was awakened and taken to the recovery room in stable and satisfactory condition. He tolerated the procedure well no intraoperative complications.   PLAN OF CARE:  H will be observed overnight with anticipated discharge in the morning.   PATIENT DISPOSITION:  PACU - hemodynamically stable.

## 2012-06-21 NOTE — Procedures (Signed)
Interventional Radiology Procedure Note  Procedure: LEFT percutaneous nephrostomy with placement of a 5F catheter into a posterior, lower pole calyx, past the UPJ stone and into the bladder. Complications: No immediate Recommendations: - To OR for PCNL  Signed,  Sterling Big, MD Vascular & Interventional Radiologist [facility]

## 2012-06-21 NOTE — H&P (Signed)
Agree with above.  Will Proceed with LEFT percutaneous nephrostomy placement under imaging guidance.  Percutaneous access can then be used for PCNL today.  Signed,  Sterling Big, MD Vascular & Interventional Radiologist [facility]iology

## 2012-06-21 NOTE — Progress Notes (Signed)
Mr. Turnage has no significant complaints. He does have some slight flank soreness. Otherwise he seems to be doing well. He is tolerating his Foley catheter. He is having some slight irritative symptoms from his stent.  Vital signs are stable.  His abdomen is soft and nontender. His flank reveals no ecchymose with an intact dressing. The Foley catheter is draining slightly pink urine with no clots but is nearly clear at this time.  Impression: Status post left percutaneous nephrolithotomy. He is doing well postoperatively.  Plan: Per orders with Foley catheter removal in the morning and anticipated discharge at that time.

## 2012-06-21 NOTE — Anesthesia Postprocedure Evaluation (Signed)
  Anesthesia Post-op Note  Patient: Victor Nixon  Procedure(s) Performed: Procedure(s) (LRB): NEPHROLITHOTOMY PERCUTANEOUS (Left)  Patient Location: PACU  Anesthesia Type: General  Level of Consciousness: awake and alert   Airway and Oxygen Therapy: Patient Spontanous Breathing  Post-op Pain: mild  Post-op Assessment: Post-op Vital signs reviewed, Patient's Cardiovascular Status Stable, Respiratory Function Stable, Patent Airway and No signs of Nausea or vomiting  Post-op Vital Signs: stable  Complications: No apparent anesthesia complications

## 2012-06-21 NOTE — Transfer of Care (Signed)
Immediate Anesthesia Transfer of Care Note  Patient: Victor Nixon  Procedure(s) Performed: Procedure(s) (LRB): NEPHROLITHOTOMY PERCUTANEOUS (Left)  Patient Location: PACU  Anesthesia Type: General  Level of Consciousness: awake, sedated and patient cooperative  Airway & Oxygen Therapy: Patient Spontanous Breathing and Patient connected to face mask oxygen  Post-op Assessment: Report given to PACU RN and Post -op Vital signs reviewed and stable  Post vital signs: Reviewed and stable  Complications: No apparent anesthesia complications

## 2012-06-21 NOTE — Interval H&P Note (Signed)
History and Physical Interval Note:  06/21/2012 10:21 AM  Victor Nixon  has presented today for surgery, with the diagnosis of left ureteropelvic junction stone  The various methods of treatment have been discussed with the patient and family. After consideration of risks, benefits and other options for treatment, the patient has consented to  Procedure(s) (LRB): NEPHROLITHOTOMY PERCUTANEOUS (Left) as a surgical intervention .  The patient's history has been reviewed, patient examined, no change in status, stable for surgery.  I have reviewed the patient's chart and labs.  Questions were answered to the patient's satisfaction.     [name]

## 2012-06-21 NOTE — H&P (Signed)
Victor Nixon is an 46 y.o. male.   Chief Complaint: left renal stone with hydronephrosis. Patient presents today for left percutaneous nephrostomy placement pre-litho procedure.    HPI: see urology  Note below : Victor Farm, MD Physician Signed Urology H&P 06/20/2012 3:49 PM  Related encounter: Pre-admit from 06/21/2012 in [facility]    History of Present Illness        Left nephrolithiasis: The patient underwent a renal ultrasound on 05/17/12 for left lower quadrant pain and was found to have left-sided hydronephrosis. A followup CT scan done 2 days later revealed a 15 mm stone in the left renal pelvis with mild hydronephrosis noted. The stone was actually located at the ureteropelvic junction and I measured Hounsfield units of 1250. No right renal calculi were identified. I also reviewed a CT scan done on 07/25/10 which revealed a stone located at the same position measuring 5 mm in size and also causing mild dilatation of the collecting system. He thought he passed his stone then except what he describes sounds like it may have just been a small clot. A urine culture was found to be negative and a urinalysis prior to that was clear. The creatinine was noted to be normal at 0.83. He's been taking tamsulosin which he finds actually does help relieve some of the bloating type sensation that he has experienced.   Past Medical History Problems  1. History of  Hypercholesterolemia 272.0 2. History of  Murmurs 785.2   Surgical History Problems  1. History of  Hernia Repair   Current Meds 1. Fish Oil 1000 MG Oral Capsule; Therapy: (Recorded:27Jun2013) to 2. Flomax 0.4 MG Oral Capsule; Therapy: (Recorded:27Jun2013) to   Allergies Medication  1. Keflex TABS   Family History Problems  1. Family history of  No Significant Family History   Social History Problems    Alcohol Use   Caffeine Use   Marital History - Currently Married   Never A Smoker   Review of  Systems Genitourinary, constitutional, skin, eye, otolaryngeal, hematologic/lymphatic, cardiovascular, pulmonary, endocrine, musculoskeletal, gastrointestinal, neurological and psychiatric system(s) were reviewed and pertinent findings if present are noted.  Gastrointestinal: abdominal pain.     Vitals Vital Signs  BMI Calculated: 25.77 BSA Calculated: 2 Height: 5 ft 10 in Weight: 180 lb   Blood Pressure: 102 / 64 Heart Rate: 64   Physical Exam Constitutional: Well nourished and well developed . No acute distress.  ENT:. The ears and nose are normal in appearance.  Neck: The appearance of the neck is normal and no neck mass is present.  Pulmonary: No respiratory distress and normal respiratory rhythm and effort.  Cardiovascular: Heart rate and rhythm are normal . No peripheral edema.  Abdomen: The abdomen is soft and nontender. No masses are palpated. No CVA tenderness. No hernias are palpable. No hepatosplenomegaly noted.  Lymphatics: The femoral and inguinal nodes are not enlarged or tender.  Skin: Normal skin turgor, no visible rash and no visible skin lesions.  Neuro/Psych:. Mood and affect are appropriate.     Results/Data Urine COLOR  STRAW    APPEARANCE  CLEAR    SPECIFIC GRAVITY  1.010    pH  5.0    GLUCOSE  NEG mg/dL   BILIRUBIN  NEG    KETONE  NEG mg/dL   BLOOD  NEG    PROTEIN  NEG mg/dL   UROBILINOGEN  0.2 mg/dL   NITRITE  NEG    LEUKOCYTE ESTERASE  TRACE  SQUAMOUS EPITHELIAL/HPF  NONE SEEN    WBC  0-2 WBC/hpf   RBC  NONE SEEN RBC/hpf   BACTERIA  NONE SEEN    CRYSTALS  NONE SEEN    CASTS  NONE SEEN      The following images/tracing/specimen were independently visualized:  CT scan as above.  The following clinical lab reports were reviewed:  Urinalysis and creatinine as above.  The following radiology reports were reviewed: CT scan.     Assessment Assessed  1. Nephrolithiasis Of The Left Kidney 592.0 2. Hydronephrosis On The Left 591    I have  discussed with the patient the fact that he has a large stone located ureteropelvic junction on the left-hand side with very high Hounsfield units. We discussed the treatment options and certainly due to the size of the stone he would not be a candidate for ureteroscopic management. Lithotripsy could be performed with the placement of a stent prior to the procedure however due to the stone size and density this would likely require more than one treatment with lithotripsy. I therefore have recommended a percutaneous nephrolithotomy. I went over this procedure with him in detail and drew pictures to explain how the procedure was performed, we then discussed its risks and complications and probability of success as well as possible need for stent and nephrostomy tube postoperatively. He understands and has elected to proceed.    Plan      He is scheduled for a left percutaneous nephrolithotomy.   Examination for today's procedure :   Past Medical History  Diagnosis Date  . Chronic kidney disease     stones    Past Surgical History  Procedure Date  . Hernia repair     age 34 or 8    Social History:  reports that he has never smoked. He does not have any smokeless tobacco history on file. He reports that he drinks alcohol. He reports that he does not use illicit drugs. Married, spouse present.   Allergies:  Allergies  Allergen Reactions  . Cephalexin Rash    Results for orders placed during the hospital encounter of 06/21/12 (from the past 48 hour(s))  TYPE AND SCREEN     Status: Normal   Collection Time   06/21/12  7:45 AM      Component Value Range Comment   ABO/RH(D) O POS      Antibody Screen NEG      Sample Expiration 06/24/2012     APTT     Status: Normal   Collection Time   06/21/12  7:50 AM      Component Value Range Comment   aPTT 33  24 - 37 seconds   BASIC METABOLIC PANEL     Status: Abnormal   Collection Time   06/21/12  7:50 AM      Component Value Range Comment    Sodium 138  135 - 145 mEq/L    Potassium 3.4 (*) 3.5 - 5.1 mEq/L    Chloride 100  96 - 112 mEq/L    CO2 27  19 - 32 mEq/L    Glucose, Bld 113 (*) 70 - 99 mg/dL    BUN 12  6 - 23 mg/dL    Creatinine, Ser 1.61  0.50 - 1.35 mg/dL    Calcium 9.4  8.4 - 09.6 mg/dL    GFR calc non Af Amer >90  >90 mL/min    GFR calc Af Amer >90  >90 mL/min   CBC WITH DIFFERENTIAL  Status: Normal   Collection Time   06/21/12  7:50 AM      Component Value Range Comment   WBC 6.3  4.0 - 10.5 K/uL    RBC 5.04  4.22 - 5.81 MIL/uL    Hemoglobin 15.4  13.0 - 17.0 g/dL    HCT 57.8  46.9 - 62.9 %    MCV 87.5  78.0 - 100.0 fL    MCH 30.6  26.0 - 34.0 pg    MCHC 34.9  30.0 - 36.0 g/dL    RDW 52.8  41.3 - 24.4 %    Platelets 400  150 - 400 K/uL    Neutrophils Relative 55  43 - 77 %    Neutro Abs 3.4  1.7 - 7.7 K/uL    Lymphocytes Relative 31  12 - 46 %    Lymphs Abs 1.9  0.7 - 4.0 K/uL    Monocytes Relative 11  3 - 12 %    Monocytes Absolute 0.7  0.1 - 1.0 K/uL    Eosinophils Relative 3  0 - 5 %    Eosinophils Absolute 0.2  0.0 - 0.7 K/uL    Basophils Relative 1  0 - 1 %    Basophils Absolute 0.1  0.0 - 0.1 K/uL   PROTIME-INR     Status: Normal   Collection Time   06/21/12  7:50 AM      Component Value Range Comment   Prothrombin Time 13.5  11.6 - 15.2 seconds    INR 1.01  0.00 - 1.49     Review of Systems  Constitutional: Negative for fever, chills, weight loss and malaise/fatigue.  Respiratory: Negative.   Cardiovascular: Negative.   Gastrointestinal: Negative.   Genitourinary: Positive for hematuria and flank pain. Negative for dysuria.  Musculoskeletal: Negative.   Skin: Negative.   Endo/Heme/Allergies: Negative.   Psychiatric/Behavioral: Negative.     Blood pressure 134/78, pulse 64, temperature 98.5 F (36.9 C), temperature source Oral, resp. rate 16, height 5\' 10"  (1.778 m), weight 185 lb (83.915 kg), SpO2 100.00%. Physical Exam  Constitutional: He is oriented to person, place, and time.  He appears well-developed and well-nourished. No distress.  HENT:  Head: Normocephalic and atraumatic.  Eyes: Pupils are equal, round, and reactive to light.  Cardiovascular: Normal rate, regular rhythm and normal heart sounds.  Exam reveals no gallop and no friction rub.   No murmur heard. Respiratory: Effort normal and breath sounds normal. No respiratory distress. He has no wheezes. He has no rales.  GI: Soft. Bowel sounds are normal.  Musculoskeletal: Normal range of motion. He exhibits no edema.  Neurological: He is alert and oriented to person, place, and time.  Skin: Skin is warm and dry.  Psychiatric: He has a normal mood and affect. His behavior is normal. Judgment and thought content normal.     Assessment/Plan Procedure details for percutaneous nephrostomy drain placement discussed in detail with patient and spouse with their apparent understanding. Potential complications including but not limited to infection, bleeding, organ damage, and complications with drain placement and moderate sedation discussed and all his questions answered to his satisfaction. Labs reviewed and are WNL to proceed. Written consent obtained.    CAMPBELL,PAMELA D 06/21/2012, 8:42 AM

## 2012-06-22 ENCOUNTER — Encounter (HOSPITAL_COMMUNITY): Payer: Self-pay | Admitting: Urology

## 2012-06-22 MED ORDER — DIAZEPAM 10 MG PO TABS: ORAL_TABLET | ORAL | Status: DC

## 2012-06-22 MED ORDER — HYDROCODONE-ACETAMINOPHEN 10-325 MG PO TABS: 1.0000 | ORAL_TABLET | ORAL | Status: AC | PRN

## 2012-06-22 MED ORDER — URIBEL 118 MG PO CAPS: 1.0000 | ORAL_CAPSULE | Freq: Four times a day (QID) | ORAL | Status: DC | PRN

## 2012-06-22 NOTE — Progress Notes (Addendum)
Patient was assisted to get out of bed with a staff member.The patient and staff member ambulated a short distance in the hallway. The patient returned to the bed.

## 2012-06-22 NOTE — Progress Notes (Signed)
Assessment unchanged.  Pt given scripts per MD order.  D/C instructions were given and D/C papers were signed.  All additional questions/concerns were addressed and answered.  Pt D/C'd via wheelchair and accompanied by NT.

## 2012-06-24 LAB — GLUCOSE, CAPILLARY: Glucose-Capillary: 134 mg/dL — ABNORMAL HIGH (ref 70–99)

## 2012-06-29 ENCOUNTER — Encounter: Payer: Self-pay | Admitting: Family Medicine

## 2012-09-02 IMAGING — XA IR INTRO URET CATH PERC *L*
2 series · 10 of 10 positions shown · non-contrast
Comparison: CT of the abdomen and pelvis - 05/19/2012

FLUOROSCOPIC GUIDANCE FOR PUNCTURE OF THE LEFT RENAL COLLECTING
SYSTEM,
LEFT PERCUTANEOUS NEPHROSTOMY TUBE PLACEMENT

Date: 06/21/12
CLINICAL HISTORY: Renal stones, access for left percutaneous
nephrolithotomy.
Procedures Performed:
   1.    Percutaneous puncture of the renal collecting system under
      fluoroscopic guidance
2.    Placement of a percutaneous nephrostomy tube

[Series 1: care single · 1 of 1 slices shown]
[im 1/1]
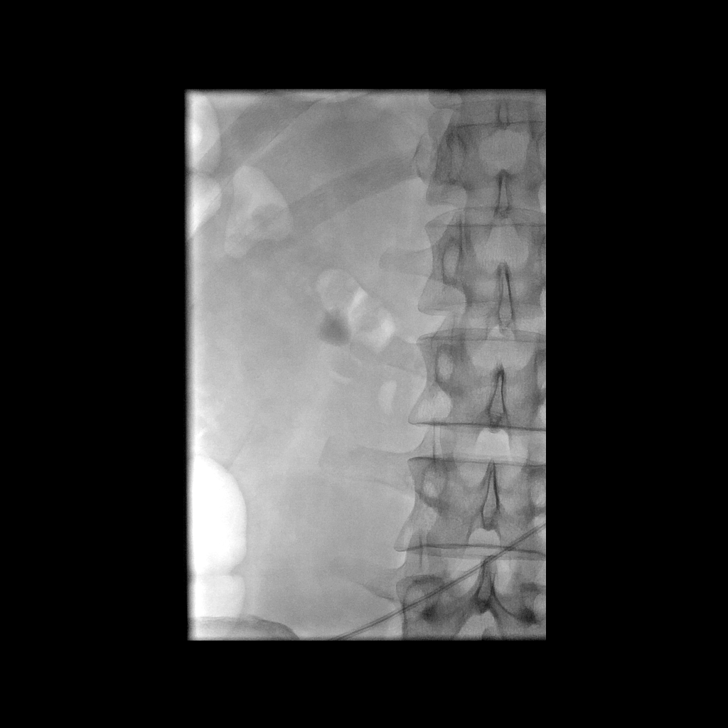

[Series 300: tube placements · 9 of 9 slices shown]
[im 1/9]
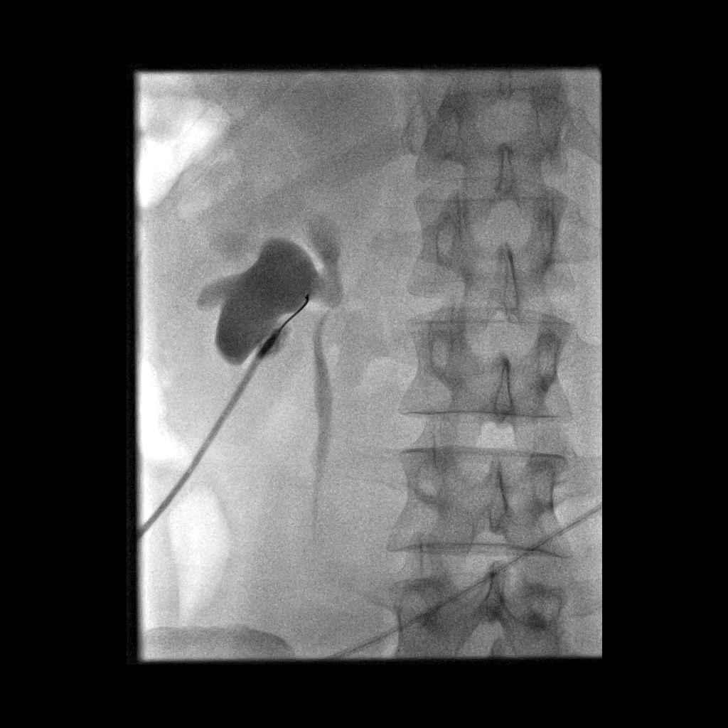
[im 2/9]
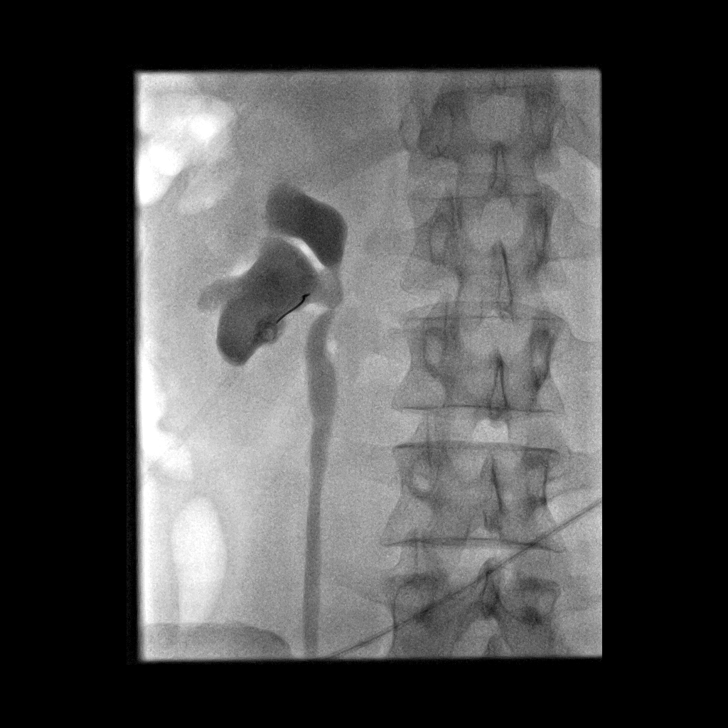
[im 3/9]
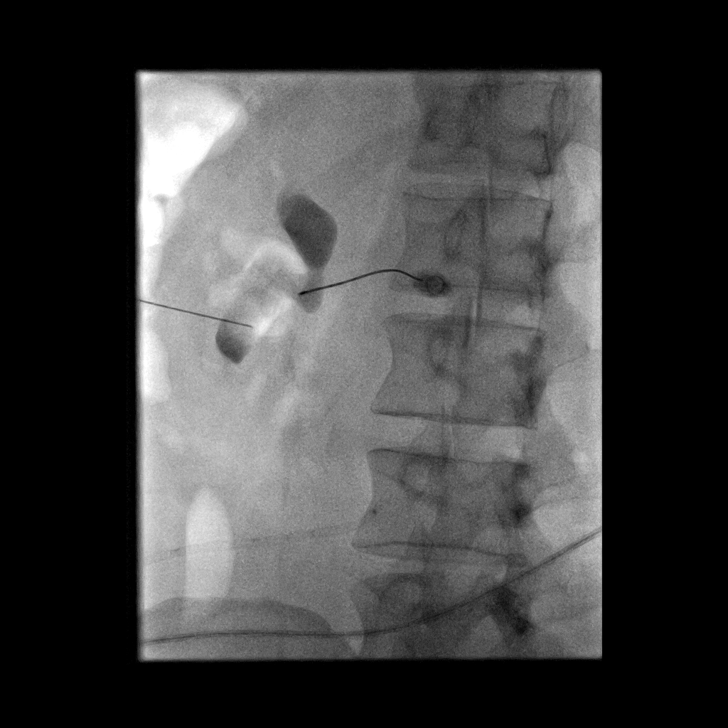
[im 4/9]
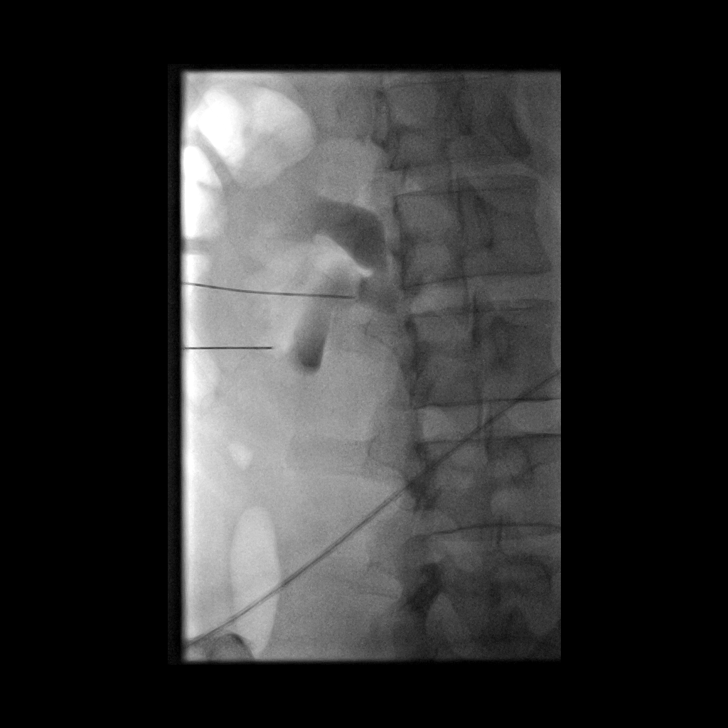
[im 5/9]
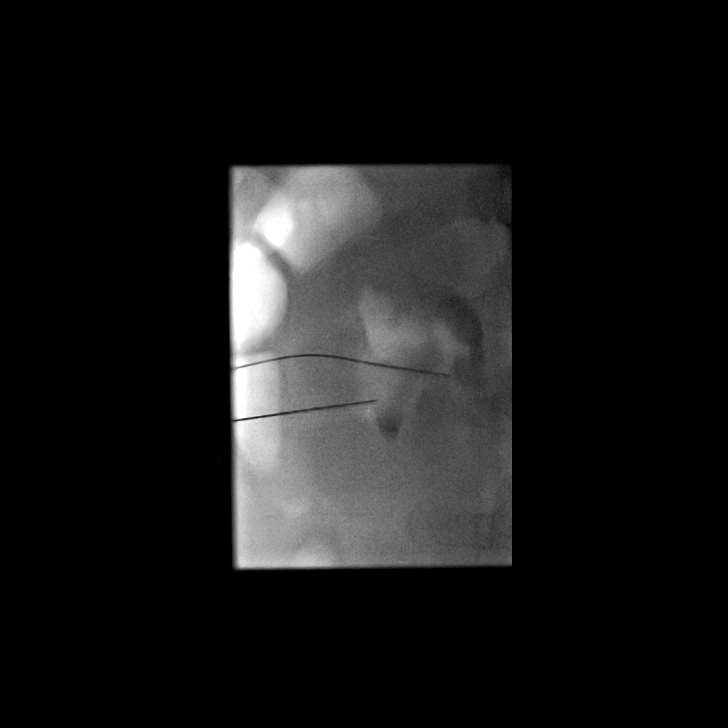
[im 6/9]
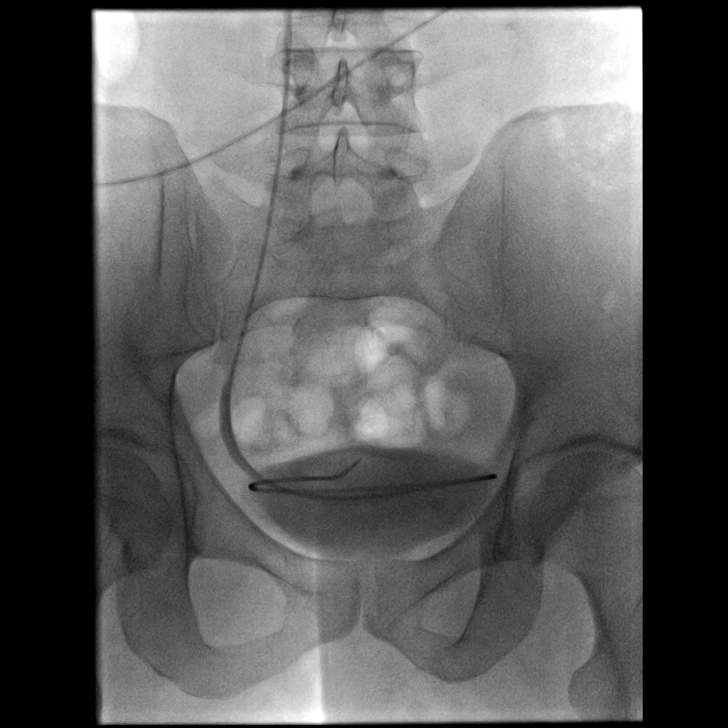
[im 7/9]
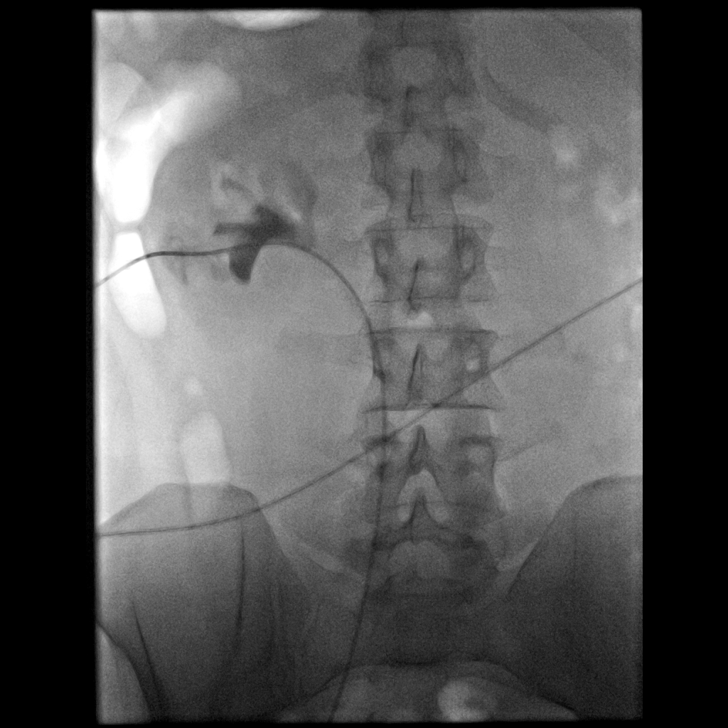
[im 8/9]
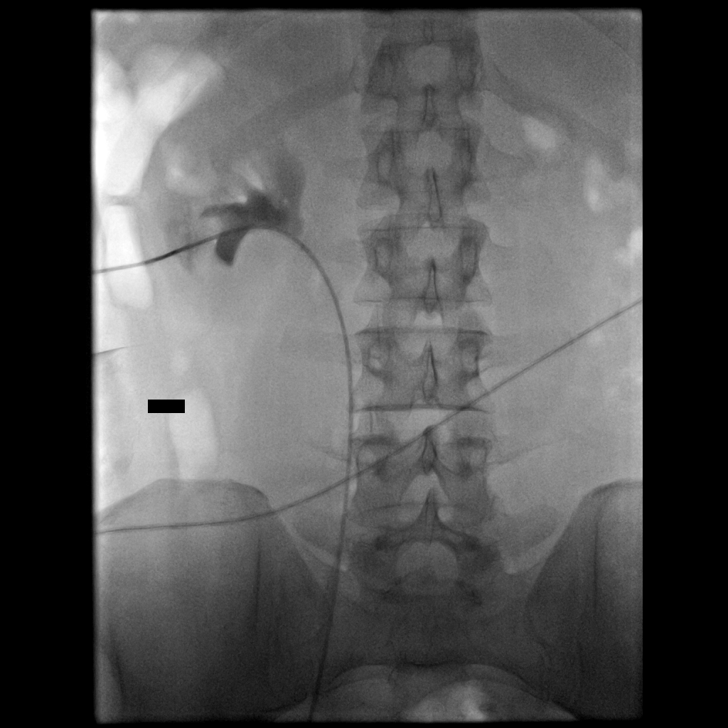
[im 9/9]
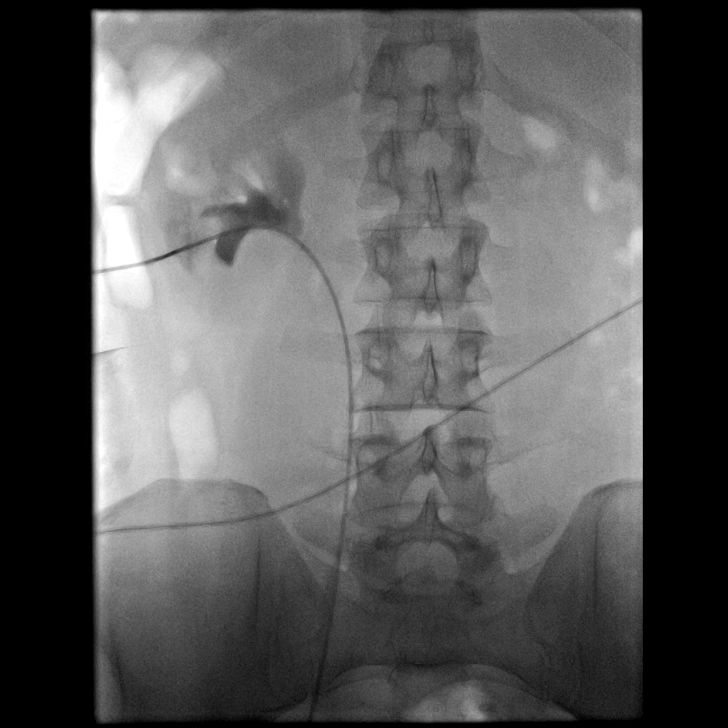

[10 of 10 positions shown; findings below may reference images not displayed]

Sedation: Moderate (conscious) sedation was used.  Five mg Versed,
250 mcg Fentanyl were administered intravenously.  The patient's
vital signs were monitored continuously by radiology nursing
throughout the procedure.  400 mg Cipro was administered IV at an
appropriate time prior to skin puncture.

Sedation Time: 16 minutes

Fluoroscopy time: 10.3 minutes

Contrast volume: Approximately 100 ml Omnipaque instilled into the
collecting system.

PROCEDURE/FINDINGS:

Informed written consent was obtained from the patient after a
discussion of the risks, benefits, and alternatives to treatment.
The left flank region was prepped with Betadine in a sterile
fashion, and a sterile drape was applied covering the operative
field.  A sterile gown and sterile gloves were used for the
procedure.  A timeout was performed prior to the initiation of the
procedure.

A pre procedural spot fluoroscopic image was obtained of the upper
abdomen.  Ultrasound scanning performed of the kidney demonstrated
moderate hydronephrosis.  An initial attempt was made to puncture
the posterior, inferior calix with a 22 gauge Chiba needle under
sonographic guidance.  However, due to a large amount of motion of
the kidney related to the patient's breathing, this was abandoned.

The stone within the left ureteropelvic junction.  was targeted
fluoroscopically with a 22 gauge Chiba needle.  Access to the
collecting system was confirmed with a gentle hand injection of
contrast into the collecting system. A small amount of air was then
injected and a posterior, inferior calix identified.  A posterior
inferior calyx was targeted with a 22 gauge Chiba needle.  Access
to the calyx was confirmed with advancement of a Nitrex wire into
the collecting system.  An Accustick set was utilized to dilate the
tract and was subsequently exchanged for a Kumpe catheter over a
Bentson wire.  The Kumpe catheter was advanced down the ureter and
into the urinary bladder.  Postprocedural spot radiographs were
obtained in various obliquities and the catheter was sutured to the
skin.  The catheter was capped and a dressing was placed.  The
patient tolerated the procedure well without immediate
postprocedural complication.

Complications: None immediate
IMPRESSION: Successful fluoroscopic guided LEFT percutaneous nephrostomy with
placement of a 5 French Kumpe catheter to the level of the urinary
bladder to be utilized during impending nephrolithotomy procedure.

[REDACTED]

## 2013-06-20 ENCOUNTER — Ambulatory Visit (INDEPENDENT_AMBULATORY_CARE_PROVIDER_SITE_OTHER): Payer: Managed Care, Other (non HMO) | Admitting: Family Medicine

## 2013-06-20 ENCOUNTER — Ambulatory Visit: Payer: Managed Care, Other (non HMO)

## 2013-06-20 VITALS — BP 118/78 | HR 79 | Temp 98.0°F | Resp 18 | Ht 70.0 in | Wt 186.0 lb

## 2013-06-20 DIAGNOSIS — R0789 Other chest pain: Secondary | ICD-10-CM

## 2013-06-20 DIAGNOSIS — R071 Chest pain on breathing: Secondary | ICD-10-CM

## 2013-06-20 MED ORDER — CYCLOBENZAPRINE HCL 5 MG PO TABS: 5.0000 mg | ORAL_TABLET | Freq: Three times a day (TID) | ORAL | Status: DC | PRN

## 2013-06-20 NOTE — Progress Notes (Signed)
   [address] [place] [address]   Phone [phone]  Subjective:    Patient ID: Victor Nixon, male    DOB: 23-Mar-1966, 47 y.o.   MRN: [id number]  HPI  Pt presents to clinic with 4-5 day h/o L sided chest wall pain for the last 5 days.  He was on vacation and he flew and used a backpack on the plane.  Then on Wed he went snorkeling and got water into his mask and for the next several hours he had water running out of his nose.  He noticed that he pain has gotten worse since then but it is really more of an irritation.  He is concerned because his dad had to have a heart valve replaced.  The pain seems worse with certain movements and better with certain positions.  He is not SOB and does not have a cough.  He ran while on vacation after the discomfort started and that did not make it worse.  He took an Advil last pm and that seemed to help the pain so he was able to sleep better.  Review of Systems  Respiratory: Negative for cough, shortness of breath and wheezing.   Cardiovascular: Positive for chest pain. Negative for palpitations and leg swelling.       Objective:   Physical Exam  Vitals reviewed. Constitutional: He is oriented to person, place, and time. He appears well-developed and well-nourished.  HENT:  Head: Normocephalic and atraumatic.  Right Ear: External ear normal.  Left Ear: External ear normal.  Eyes: Conjunctivae are normal.  Neck: Normal range of motion.  Cardiovascular: Normal rate, regular rhythm and normal heart sounds.   No murmur heard. Pulmonary/Chest: Effort normal and breath sounds normal. He exhibits no tenderness.  Musculoskeletal:       Cervical back: Normal. He exhibits normal range of motion, no tenderness and no spasm.  Neurological: He is alert and oriented to person, place, and time.  Skin: Skin is warm and dry.  Psychiatric: He has a normal mood and affect. His behavior is normal. Judgment and thought content normal.    EKG - NSR  without acute changes.  [facility] reading (PRIMARY) by  Dr. Patsy Lager.  Neg.      Assessment & Plan:  Chest wall pain - Plan: EKG 12-Lead, DG Chest 2 View, cyclobenzaprine (FLEXERIL) 5 MG tablet  Seems to musculoskeletal in origin with normal EKG and CXR.  He will continue OTC motrin and monitor but I expect this to resolve.  Benny Lennert PA-C 06/20/2013 3:01 PM

## 2014-02-10 ENCOUNTER — Ambulatory Visit: Payer: 59 | Admitting: Emergency Medicine

## 2014-02-10 VITALS — BP 118/72 | HR 74 | Temp 98.2°F | Resp 18 | Ht 71.0 in | Wt 188.0 lb

## 2014-02-10 DIAGNOSIS — R202 Paresthesia of skin: Secondary | ICD-10-CM

## 2014-02-10 DIAGNOSIS — R209 Unspecified disturbances of skin sensation: Secondary | ICD-10-CM

## 2014-02-10 DIAGNOSIS — R238 Other skin changes: Secondary | ICD-10-CM

## 2014-02-10 DIAGNOSIS — E785 Hyperlipidemia, unspecified: Secondary | ICD-10-CM

## 2014-02-10 LAB — COMPREHENSIVE METABOLIC PANEL
ALK PHOS: 60 U/L (ref 39–117)
ALT: 12 U/L (ref 0–53)
AST: 21 U/L (ref 0–37)
Albumin: 4.7 g/dL (ref 3.5–5.2)
BILIRUBIN TOTAL: 0.5 mg/dL (ref 0.2–1.2)
BUN: 12 mg/dL (ref 6–23)
CO2: 25 meq/L (ref 19–32)
CREATININE: 0.82 mg/dL (ref 0.50–1.35)
Calcium: 9.6 mg/dL (ref 8.4–10.5)
Chloride: 103 mEq/L (ref 96–112)
GLUCOSE: 109 mg/dL — AB (ref 70–99)
Potassium: 4.5 mEq/L (ref 3.5–5.3)
Sodium: 138 mEq/L (ref 135–145)
Total Protein: 7.6 g/dL (ref 6.0–8.3)

## 2014-02-10 LAB — LIPID PANEL
CHOL/HDL RATIO: 4.2 ratio
CHOLESTEROL: 271 mg/dL — AB (ref 0–200)
HDL: 64 mg/dL (ref >39)
LDL Cholesterol: 192 mg/dL — ABNORMAL HIGH (ref 0–99)
TRIGLYCERIDES: 74 mg/dL (ref <150)
VLDL: 15 mg/dL (ref 0–40)

## 2014-02-10 LAB — POCT CBC
GRANULOCYTE PERCENT: 62.5 % (ref 37–80)
HCT, POC: 46.8 % (ref 43.5–53.7)
Hemoglobin: 15.3 g/dL (ref 14.1–18.1)
LYMPH, POC: 1.6 (ref 0.6–3.4)
MCH: 30.3 pg (ref 27–31.2)
MCHC: 32.7 g/dL (ref 31.8–35.4)
MCV: 92.7 fL (ref 80–97)
MID (CBC): 0.4 (ref 0–0.9)
MPV: 8.5 fL (ref 0–99.8)
PLATELET COUNT, POC: 416 10*3/uL (ref 142–424)
POC Granulocyte: 3.3 (ref 2–6.9)
POC LYMPH %: 29.7 % (ref 10–50)
POC MID %: 7.8 %M (ref 0–12)
RBC: 5.05 M/uL (ref 4.69–6.13)
RDW, POC: 13.1 %
WBC: 5.3 10*3/uL (ref 4.6–10.2)

## 2014-02-10 LAB — TSH: TSH: 1.411 u[IU]/mL (ref 0.350–4.500)

## 2014-02-10 LAB — POCT SEDIMENTATION RATE: POCT SED RATE: 10 mm/hr (ref 0–22)

## 2014-02-10 NOTE — Progress Notes (Signed)
   Subjective:    Patient ID: Victor Nixon, male    DOB: 17-Apr-1966, 48 y.o.   MRN: [id number]  HPI  48 year old male presents to office with:  Sensation of hands feeling cold on and off.  No change in color in tips of fingers that he has noticed. Tingling, or over sensitivity along hands, arms and legs.  Patient would not describe it as numbness. Good sensation in feet and legs when walking. No weakness in extremities. No blurred vision, slurred speech, dizziness. Slight periodic headaches.  Company purchased, integration, stressful time period.   Non smoker Semi regular exercise  history of hyperlipidemia  Review of Systems     Objective:   Physical Exam patient is alert and cooperative he is not in any distress. Neck is supple without thyromegaly. His chest was clear. Heart is regular rate without murmurs the abdomen is soft the liver spleen are not large no areas of tenderness were intact. Deep tendon reflexes of the upper extremities are 0-1+ but symmetrical. Lower extremities are 2+ and symmetrical. Pulses in the hands and feet are. There is normal capillary fill it under 2 seconds.  Results for orders placed in visit on 02/10/14  POCT CBC      Result Value Ref Range   WBC 5.3  4.6 - 10.2 K/uL   Lymph, poc 1.6  0.6 - 3.4   POC LYMPH PERCENT 29.7  10 - 50 %L   MID (cbc) 0.4  0 - 0.9   POC MID % 7.8  0 - 12 %M   POC Granulocyte 3.3  2 - 6.9   Granulocyte percent 62.5  37 - 80 %G   RBC 5.05  4.69 - 6.13 M/uL   Hemoglobin 15.3  14.1 - 18.1 g/dL   HCT, POC 46.8  43.5 - 53.7 %   MCV 92.7  80 - 97 fL   MCH, POC 30.3  27 - 31.2 pg   MCHC 32.7  31.8 - 35.4 g/dL   RDW, POC 13.1     Platelet Count, POC 416  142 - 424 K/uL   MPV 8.5  0 - 99.8 fL        Assessment & Plan:   labs were done. We'll proceed with an MRI of the brain with contrast rule out MS

## 2014-02-11 ENCOUNTER — Telehealth: Payer: Self-pay

## 2014-02-11 ENCOUNTER — Other Ambulatory Visit: Payer: Self-pay | Admitting: Radiology

## 2014-02-11 DIAGNOSIS — E785 Hyperlipidemia, unspecified: Secondary | ICD-10-CM

## 2014-02-11 MED ORDER — ATORVASTATIN CALCIUM 40 MG PO TABS: 40.0000 mg | ORAL_TABLET | Freq: Every day | ORAL | Status: DC

## 2014-02-11 NOTE — Telephone Encounter (Signed)
Patient called to get some more information on lab results. He said he wanted to write down some information that was given to him because he couldn't write it down before.    [phone]

## 2014-02-12 NOTE — Telephone Encounter (Signed)
Returned call and patient was given cholesterol results(numbers)

## 2014-02-27 ENCOUNTER — Ambulatory Visit (INDEPENDENT_AMBULATORY_CARE_PROVIDER_SITE_OTHER): Payer: 59 | Admitting: Physician Assistant

## 2014-02-27 ENCOUNTER — Encounter: Payer: Self-pay | Admitting: Physician Assistant

## 2014-02-27 VITALS — BP 120/70 | HR 60 | Temp 98.2°F | Resp 16 | Ht 70.0 in | Wt 182.4 lb

## 2014-02-27 DIAGNOSIS — Z0000 Encounter for general adult medical examination without abnormal findings: Secondary | ICD-10-CM

## 2014-02-27 DIAGNOSIS — R209 Unspecified disturbances of skin sensation: Secondary | ICD-10-CM

## 2014-02-27 DIAGNOSIS — R202 Paresthesia of skin: Secondary | ICD-10-CM

## 2014-02-27 DIAGNOSIS — E785 Hyperlipidemia, unspecified: Secondary | ICD-10-CM

## 2014-02-27 LAB — COMPREHENSIVE METABOLIC PANEL
ALT: 11 U/L (ref 0–53)
AST: 20 U/L (ref 0–37)
Albumin: 4.5 g/dL (ref 3.5–5.2)
Alkaline Phosphatase: 59 U/L (ref 39–117)
BILIRUBIN TOTAL: 0.4 mg/dL (ref 0.2–1.2)
BUN: 12 mg/dL (ref 6–23)
CO2: 25 mEq/L (ref 19–32)
CREATININE: 0.84 mg/dL (ref 0.50–1.35)
Calcium: 9.6 mg/dL (ref 8.4–10.5)
Chloride: 102 mEq/L (ref 96–112)
GLUCOSE: 86 mg/dL (ref 70–99)
Potassium: 4.1 mEq/L (ref 3.5–5.3)
Sodium: 137 mEq/L (ref 135–145)
Total Protein: 7.4 g/dL (ref 6.0–8.3)

## 2014-02-27 LAB — CBC
HCT: 41 % (ref 39.0–52.0)
Hemoglobin: 14.3 g/dL (ref 13.0–17.0)
MCH: 30.1 pg (ref 26.0–34.0)
MCHC: 34.9 g/dL (ref 30.0–36.0)
MCV: 86.3 fL (ref 78.0–100.0)
PLATELETS: 383 10*3/uL (ref 150–400)
RBC: 4.75 MIL/uL (ref 4.22–5.81)
RDW: 13.4 % (ref 11.5–15.5)
WBC: 7.2 10*3/uL (ref 4.0–10.5)

## 2014-02-27 LAB — POCT UA - MICROSCOPIC ONLY
Bacteria, U Microscopic: NEGATIVE
Casts, Ur, LPF, POC: NEGATIVE
Crystals, Ur, HPF, POC: NEGATIVE
Mucus, UA: NEGATIVE
RBC, URINE, MICROSCOPIC: NEGATIVE
Yeast, UA: NEGATIVE

## 2014-02-27 LAB — POCT URINALYSIS DIPSTICK
BILIRUBIN UA: NEGATIVE
Blood, UA: NEGATIVE
GLUCOSE UA: NEGATIVE
Ketones, UA: NEGATIVE
LEUKOCYTES UA: NEGATIVE
NITRITE UA: NEGATIVE
Protein, UA: NEGATIVE
Spec Grav, UA: 1.01
Urobilinogen, UA: 0.2
pH, UA: 5.5

## 2014-02-27 LAB — TSH: TSH: 1.892 u[IU]/mL (ref 0.350–4.500)

## 2014-02-27 NOTE — Progress Notes (Signed)
Patient ID: Victor Nixon MRN: [id number], DOB: 1966-03-19 48 y.o. Date of Encounter: 02/27/2014, 7:51 PM  Primary Physician: Kennon Portela, MD  Chief Complaint: Physical (CPE)  HPI: 48 y.o. male with history noted below here for CPE. Doing well. Last physical was 5-10 years ago. He was recently seen by Dr. Everlene Farrier on 02/10/14 for upper extremity paresthesias. Was referred for MRI of brain. Has not yet had this done. States the upper extremity paresthesias has resolved. He now has some cramping in his left calf muscle off and on, but has been working out quite a bit secondary to his recent lipid profile.   Patient has not yet started Lipitor. He does not want to start this. Has completely modified his diet along with his wife. Working out quite a bit. Since he started out he started having some cramping in his left calf muscle. Has lost 5 pounds since 02/10/14 with improved diet and exercise.   Review of Systems: Consitutional: No fever, chills, fatigue, night sweats, lymphadenopathy, or weight changes. Eyes: No visual changes, eye redness, or discharge. ENT/Mouth: Ears: No otalgia, tinnitus, hearing loss, discharge. Nose: No congestion, rhinorrhea, sinus pain, or epistaxis. Throat: No sore throat, post nasal drip, or teeth pain. Cardiovascular: No CP, palpitations, diaphoresis, DOE, edema, orthopnea, PND. Respiratory: No cough, hemoptysis, SOB, or wheezing. Gastrointestinal: No anorexia, dysphagia, reflux, pain, nausea, vomiting, hematemesis, diarrhea, constipation, BRBPR, or melena. Genitourinary: No dysuria, frequency, urgency, hematuria, incontinence, nocturia, decreased urinary stream, discharge, impotence, or testicular pain/masses. Musculoskeletal: Positive for cramping. No decreased ROM, myalgias, stiffness, joint swelling, or weakness. Skin: No rash, erythema, lesion changes, pain, warmth, jaundice, or pruritis. Neurological: No headache, dizziness, syncope, seizures, tremors,  memory loss, coordination problems, or paresthesias. Psychological: No anxiety, depression, hallucinations, SI/HI. Endocrine: No fatigue, polydipsia, polyphagia, polyuria, or known diabetes.   Past Medical History  Diagnosis Date  . Chronic kidney disease     stones  . Allergy   . Heart murmur      Past Surgical History  Procedure Laterality Date  . Hernia repair      age 7 or 20  . Nephrolithotomy  06/21/2012    Procedure: NEPHROLITHOTOMY PERCUTANEOUS;  Surgeon: Claybon Jabs, MD;  Location: [facility];  Service: Urology;  Laterality: Left;  left ureteral stent    Home Meds:  Prior to Admission medications   Medication Sig Start Date End Date Taking? Authorizing Provider  atorvastatin (LIPITOR) 40 MG tablet Take 1 tablet (40 mg total) by mouth daily. 02/11/14  No Darlyne Russian, MD  cyclobenzaprine (FLEXERIL) 5 MG tablet Take 1 tablet (5 mg total) by mouth 3 (three) times daily as needed for muscle spasms. 06/20/13  No Mancel Bale, PA-C  fish oil-omega-3 fatty acids 1000 MG capsule Take 1 g by mouth daily.   Yes Historical Provider, MD    Allergies:  Allergies  Allergen Reactions  . Cephalexin Rash    History   Social History  . Marital Status: Married    Spouse Name: N/A    Number of Children: N/A  . Years of Education: N/A   Occupational History  . Not on file.   Social History Main Topics  . Smoking status: Never Smoker   . Smokeless tobacco: Not on file  . Alcohol Use: 0.0 oz/week    1-2 Glasses of wine per week     Comment: occassional beer, liquor  . Drug Use: No  . Sexual Activity: Yes    Birth Control/ Protection:  Abstinence   Other Topics Concern  . Not on file   Social History Narrative  . No narrative on file    Family History  Problem Relation Age of Onset  . Hip fracture Mother   . Cancer Father     Physical Exam: Blood pressure 120/70, pulse 60, temperature 98.2 F (36.8 C), temperature source Oral, resp. rate 16, height 5\' 10"  (1.778  m), weight 182 lb 6.4 oz (82.736 kg), SpO2 100.00%.  General: Well developed, well nourished, in no acute distress. HEENT: Normocephalic, atraumatic. Conjunctiva pink, sclera non-icteric. Pupils 2 mm constricting to 1 mm, round, regular, and equally reactive to light and accomodation. EOMI. Internal auditory canal clear. TMs with good cone of light and without pathology. Nasal mucosa pink. Nares are without discharge. No sinus tenderness. Oral mucosa pink. Dentition normal. Pharynx without exudate.   Neck: Supple. Trachea midline. No thyromegaly. Full ROM. No lymphadenopathy. Lungs: Clear to auscultation bilaterally without wheezes, rales, or rhonchi. Breathing is of normal effort and unlabored. Cardiovascular: RRR with S1 S2. No murmurs, rubs, or gallops appreciated. Distal pulses 2+ symmetrically. No carotid or abdominal bruits. Abdomen: Soft, non-tender, non-distended with normoactive bowel sounds. No hepatosplenomegaly or masses. No rebound/guarding. No CVA tenderness. Without hernias.   Genitourinary: Circumcised male. No penile lesions. Testes descended bilaterally, and smooth without tenderness or masses.  Musculoskeletal: Full range of motion and 5/5 strength throughout. Without swelling, atrophy, tenderness, crepitus, or warmth. Extremities without clubbing, cyanosis, or edema. Calves supple. Skin: Warm and moist without erythema, ecchymosis, wounds, or rash. Neuro: A+Ox3. CN II-XII grossly intact. Moves all extremities spontaneously. Full sensation throughout. Normal gait. DTR 2+ throughout upper and lower extremities. Finger to nose intact. Psych:  Responds to questions appropriately with a normal affect.   Studies:    CBC, CMET, PSA, TSH all pending. Patient is not fasting. FLP pending when patient is fasting.    Assessment/Plan:  48 y.o. male here for CPE with hyperlipidemia, history of paresthesia, and muscle cramping.    1) Hyperlipidemia -Patient does not want to start  Lipitor at this time. He prefers to have a trial of lifestyle modification. Given his demonstration of hard work from prior visit to today this is reasonable. I will have him RTC for a FLP within the next week as his last lipid panel was not fasting. He is to continue to work hard on diet modification and exercise over the next 6 months then recheck with a FLP. If at that time his labs dictate he needs a statin he agrees to start one.   2) History of paresthesia -Advised patient to follow up with MRI -He agrees to do so -Follow up pending MRI  3) Muscle cramping -NSAIDs -Heat -Rest -If worsens or persists RTC  4) CPE -Healthy diet and exercise -Weight loss -Age appropriate anticipatory guidance    Signed, Christell Faith, PA-C [facility] [place], [place] [address] [phone] 02/27/2014 7:51 PM

## 2014-09-21 ENCOUNTER — Telehealth: Payer: Self-pay

## 2014-09-21 NOTE — Telephone Encounter (Signed)
Pt needs form filled out based on cpe completed by Christell Faith in April 2015. Form sent to 102 via interoffice mail.  Please call when ready Pt needs tomorrow.  bf

## 2014-09-21 NOTE — Telephone Encounter (Signed)
Completed form and had Chelle sign. Notified pt ready and copied for scanning.

## 2014-09-21 NOTE — Telephone Encounter (Signed)
Form signed.

## 2015-10-12 ENCOUNTER — Ambulatory Visit (INDEPENDENT_AMBULATORY_CARE_PROVIDER_SITE_OTHER): Payer: 59

## 2015-10-12 ENCOUNTER — Ambulatory Visit (INDEPENDENT_AMBULATORY_CARE_PROVIDER_SITE_OTHER): Payer: 59 | Admitting: Physician Assistant

## 2015-10-12 ENCOUNTER — Telehealth: Payer: Self-pay | Admitting: Physician Assistant

## 2015-10-12 VITALS — BP 116/64 | HR 78 | Temp 98.0°F | Resp 14 | Ht 70.75 in | Wt 182.8 lb

## 2015-10-12 DIAGNOSIS — M25521 Pain in right elbow: Secondary | ICD-10-CM

## 2015-10-12 DIAGNOSIS — W19XXXA Unspecified fall, initial encounter: Secondary | ICD-10-CM

## 2015-10-12 DIAGNOSIS — S5291XA Unspecified fracture of right forearm, initial encounter for closed fracture: Secondary | ICD-10-CM | POA: Diagnosis not present

## 2015-10-12 DIAGNOSIS — Z23 Encounter for immunization: Secondary | ICD-10-CM | POA: Diagnosis not present

## 2015-10-12 DIAGNOSIS — M719 Bursopathy, unspecified: Secondary | ICD-10-CM

## 2015-10-12 MED ORDER — IBUPROFEN 600 MG PO TABS: 600.0000 mg | ORAL_TABLET | Freq: Three times a day (TID) | ORAL | Status: DC | PRN

## 2015-10-12 NOTE — Telephone Encounter (Signed)
Relayed xray over-read. Patient will return for splinting.

## 2015-10-12 NOTE — Patient Instructions (Addendum)
Elbow Bursitis Elbow bursitis is inflammation of the fluid-filled sac (bursa) between the tip of your elbow bone (olecranon) and your skin. Elbow bursitis may also be called olecranon bursitis. Normally, the olecranon bursa has only a small amount of fluid in it to cushion and protect your elbow bone. Elbow bursitis causes fluid to build up inside the bursa. Over time, this swelling and inflammation can cause pain when you bend or lean on your elbow.  CAUSES Elbow bursitis may be caused by:   Elbow injury (acute trauma).  Leaning on hard surfaces for long periods of time.  Infection from an injury that breaks the skin near your elbow.  A bone growth (spur) that forms at the tip of your elbow.  A medical condition that causes inflammation in your body, such as gout or rheumatoid arthritis.  The cause may also be unknown.  SIGNS AND SYMPTOMS  The first sign of elbow bursitis is usually swelling over the tip of your elbow. This can grow to be the size of a golf ball. This may start suddenly or develop gradually. You may also have:  Pain when bending or leaning on your elbow.  Restricted movement of your elbow.  If your bursitis is caused by an infection, symptoms may also include:  Redness, warmth, and tenderness of the elbow.  Drainage of pus from the swollen area over your elbow, if the skin breaks open. DIAGNOSIS  Your health care provider may be able to diagnose elbow bursitis based on your signs and symptoms, especially if you have recently been injured. Your health care provider will also do a physical exam. This may include:  X-rays to look for a bone spur or a bone fracture.  Draining fluid from the bursa to test it for infection.  Blood tests to rule out gout or rheumatoid arthritis. TREATMENT  Treatment for elbow bursitis depends on the cause. Treatment may include:  Medicines. These may include:  Over-the-counter medicines to relieve pain and  inflammation.  Antibiotic medicines to fight infection.  Injections of anti-inflammatory medicines (steroids).  Wrapping your elbow with a bandage.  Draining fluid from the bursa.  Wearing elbow pads.  If your bursitis does not get better with treatment, surgery may be needed to remove the bursa.  HOME CARE INSTRUCTIONS   Take medicines only as directed by your health care provider.  If you were prescribed an antibiotic medicine, finish all of it even if you start to feel better.  If your bursitis is caused by an injury, rest your elbow and wear your bandage as directed by your health care provider. You may alsoapply ice to the injured area as directed by your health care provider:  Put ice in a plastic bag.  Place a towel between your skin and the bag.  Leave the ice on for 20 minutes, 2-3 times per day.  Avoid any activities that cause elbow pain.  Use elbow pads or elbow wraps to cushion your elbow. SEEK MEDICAL CARE IF:  You have a fever.   Your symptoms do not get better with treatment.  Your pain or swelling gets worse.  Your elbow pain or swelling goes away and then returns.  You have drainage of pus from the swollen area over your elbow.   This information is not intended to replace advice given to you by your health care provider. Make sure you discuss any questions you have with your health care provider.   Document Released: 12/10/2006 Document Revised: 12/01/2014 Document  Reviewed: 07/19/2014 Elsevier Interactive Patient Education 2016 [place]. Influenza Virus Vaccine injection What is this medicine? INFLUENZA VIRUS VACCINE (in floo EN zuh VAHY ruhs vak SEEN) helps to reduce the risk of getting influenza also known as the flu. The vaccine only helps protect you against some strains of the flu. This medicine may be used for other purposes; ask your health care provider or pharmacist if you have questions. What should I tell my health care provider  before I take this medicine? They need to know if you have any of these conditions: -bleeding disorder like hemophilia -fever or infection -Guillain-Barre syndrome or other neurological problems -immune system problems -infection with the human immunodeficiency virus (HIV) or AIDS -low blood platelet counts -multiple sclerosis -an unusual or allergic reaction to influenza virus vaccine, latex, other medicines, foods, dyes, or preservatives. Different brands of vaccines contain different allergens. Some may contain latex or eggs. Talk to your doctor about your allergies to make sure that you get the right vaccine. -pregnant or trying to get pregnant -breast-feeding How should I use this medicine? This vaccine is for injection into a muscle or under the skin. It is given by a health care professional. A copy of Vaccine Information Statements will be given before each vaccination. Read this sheet carefully each time. The sheet may change frequently. Talk to your healthcare provider to see which vaccines are right for you. Some vaccines should not be used in all age groups. Overdosage: If you think you have taken too much of this medicine contact a poison control center or emergency room at once. NOTE: This medicine is only for you. Do not share this medicine with others. What if I miss a dose? This does not apply. What may interact with this medicine? -chemotherapy or radiation therapy -medicines that lower your immune system like etanercept, anakinra, infliximab, and adalimumab -medicines that treat or prevent blood clots like warfarin -phenytoin -steroid medicines like prednisone or cortisone -theophylline -vaccines This list may not describe all possible interactions. Give your health care provider a list of all the medicines, herbs, non-prescription drugs, or dietary supplements you use. Also tell them if you smoke, drink alcohol, or use illegal drugs. Some items may interact with your  medicine. What should I watch for while using this medicine? Report any side effects that do not go away within 3 days to your doctor or health care professional. Call your health care provider if any unusual symptoms occur within 6 weeks of receiving this vaccine. You may still catch the flu, but the illness is not usually as bad. You cannot get the flu from the vaccine. The vaccine will not protect against colds or other illnesses that may cause fever. The vaccine is needed every year. What side effects may I notice from receiving this medicine? Side effects that you should report to your doctor or health care professional as soon as possible: -allergic reactions like skin rash, itching or hives, swelling of the face, lips, or tongue Side effects that usually do not require medical attention (report to your doctor or health care professional if they continue or are bothersome): -fever -headache -muscle aches and pains -pain, tenderness, redness, or swelling at the injection site -tiredness This list may not describe all possible side effects. Call your doctor for medical advice about side effects. You may report side effects to FDA at 1-800-FDA-1088. Where should I keep my medicine? The vaccine will be given by a health care professional in a clinic, pharmacy,  doctor's office, or other health care setting. You will not be given vaccine doses to store at home. NOTE: This sheet is a summary. It may not cover all possible information. If you have questions about this medicine, talk to your doctor, pharmacist, or health care provider.    2016, Elsevier/Gold Standard. (2015-06-01 10:07:28)

## 2015-10-12 NOTE — Progress Notes (Addendum)
   Subjective:    Patient ID: Victor Nixon, male    DOB: [birth year], 49 y.o.   MRN: [id number]  HPI Patient presents to clinic for right elbow pain following fall last night while roller skating. Fell onto palms of hands with most of weight on right side. Did not fall onto elbow. Starting having pain is swelling in elbow that evening. Pain mostly localized to above olecranon, but radiates some to lower arm. Denies redness, warmth, fever, or loss of sensation. Endorses decreased ROM. Denies past injury or bursitis. Med allergy cephalexin.    Review of Systems As noted above.    Objective:   Physical Exam  Constitutional: He is oriented to person, place, and time. He appears well-developed and well-nourished. No distress.  Blood pressure 116/64, pulse 78, temperature 98 F (36.7 C), temperature source Oral, resp. rate 14, height 5' 10.75" (1.797 m), weight 182 lb 12.8 oz (82.918 kg), SpO2 99 %.  HENT:  Head: Normocephalic and atraumatic.  Right Ear: External ear normal.  Left Ear: External ear normal.  Eyes: Conjunctivae are normal. Right eye exhibits no discharge. Left eye exhibits no discharge. No scleral icterus.  Pulmonary/Chest: Effort normal.  Musculoskeletal: He exhibits edema and tenderness.       Right shoulder: Normal.       Right elbow: He exhibits decreased range of motion (secondary to pain), swelling and effusion. He exhibits no deformity and no laceration. Tenderness found. Olecranon process tenderness noted. No radial head, no medial epicondyle and no lateral epicondyle tenderness noted.       Right wrist: Normal.       Right upper arm: Normal.       Right forearm: Normal.       Right hand: Normal.  Neurological: He is alert and oriented to person, place, and time. No cranial nerve deficit. He exhibits normal muscle tone. Coordination normal.  Skin: Skin is warm and dry. No rash noted. He is not diaphoretic. No erythema. No pallor.  Psychiatric: He has a normal mood  and affect. His behavior is normal. Judgment and thought content normal.   [facility] reading (PRIMARY) by  Dr. Brigitte Pulse. No acute bony abnormalities. Effusion.      Assessment & Plan:  1. Radial fracture, right, closed, initial encounter 2. Fall with injury 3. Elbow pain, right Long posterior splint placed with ortho referral  - DG Elbow Complete Right; Future - ibuprofen (ADVIL,MOTRIN) 600 MG tablet; Take 1 tablet (600 mg total) by mouth every 8 (eight) hours as needed.  Dispense: 30 tablet; Refill: 0 - Amb ref othopedist  4. Need for prophylactic vaccination and inoculation against influenza - Flu Vaccine QUAD 36+ mos IM   Emmabelle Fear PA-C  [facility] 10/12/2015 9:33 AM

## 2016-02-18 ENCOUNTER — Ambulatory Visit: Payer: 59

## 2016-02-18 ENCOUNTER — Ambulatory Visit (INDEPENDENT_AMBULATORY_CARE_PROVIDER_SITE_OTHER): Payer: 59 | Admitting: Family Medicine

## 2016-02-18 VITALS — BP 122/82 | HR 73 | Temp 98.0°F | Resp 16 | Ht 70.0 in | Wt 183.8 lb

## 2016-02-18 DIAGNOSIS — M79662 Pain in left lower leg: Secondary | ICD-10-CM

## 2016-02-18 DIAGNOSIS — Z808 Family history of malignant neoplasm of other organs or systems: Secondary | ICD-10-CM | POA: Insufficient documentation

## 2016-02-18 LAB — POCT CBC
Granulocyte percent: 57.9 %G (ref 37–80)
HCT, POC: 41.7 % — AB (ref 43.5–53.7)
Hemoglobin: 15.2 g/dL (ref 14.1–18.1)
LYMPH, POC: 1.6 (ref 0.6–3.4)
MCH, POC: 32.1 pg — AB (ref 27–31.2)
MCHC: 36.5 g/dL — AB (ref 31.8–35.4)
MCV: 88 fL (ref 80–97)
MID (cbc): 0.5 (ref 0–0.9)
MPV: 7.4 fL (ref 0–99.8)
PLATELET COUNT, POC: 341 10*3/uL (ref 142–424)
POC Granulocyte: 2.9 (ref 2–6.9)
POC LYMPH %: 32.1 % (ref 10–50)
POC MID %: 10 %M (ref 0–12)
RBC: 4.74 M/uL (ref 4.69–6.13)
RDW, POC: 13.1 %
WBC: 5 10*3/uL (ref 4.6–10.2)

## 2016-02-18 LAB — COMPREHENSIVE METABOLIC PANEL
ALK PHOS: 53 U/L (ref 40–115)
ALT: 12 U/L (ref 9–46)
AST: 18 U/L (ref 10–40)
Albumin: 4.5 g/dL (ref 3.6–5.1)
BILIRUBIN TOTAL: 0.5 mg/dL (ref 0.2–1.2)
BUN: 15 mg/dL (ref 7–25)
CO2: 28 mmol/L (ref 20–31)
CREATININE: 0.85 mg/dL (ref 0.60–1.35)
Calcium: 9.4 mg/dL (ref 8.6–10.3)
Chloride: 102 mmol/L (ref 98–110)
Glucose, Bld: 80 mg/dL (ref 65–99)
Potassium: 4.6 mmol/L (ref 3.5–5.3)
SODIUM: 137 mmol/L (ref 135–146)
TOTAL PROTEIN: 7.4 g/dL (ref 6.1–8.1)

## 2016-02-18 LAB — POCT SEDIMENTATION RATE: POCT SED RATE: 9 mm/h (ref 0–22)

## 2016-02-18 NOTE — Patient Instructions (Addendum)
Your appointment is at [place] at [facility].  You will need to check in at the [place] which off of [address] and register at [employer].    IF you received an x-ray today, you will receive an invoice from [facility]. Please contact [facility] at [phone] with questions or concerns regarding your invoice.   IF you received labwork today, you will receive an invoice from [employer]. Please contact [facility] at [phone] with questions or concerns regarding your invoice.   Our billing staff will not be able to assist you with questions regarding bills from these companies.  You will be contacted with the lab results as soon as they are available. The fastest way to get your results is to activate your My Chart account. Instructions are located on the last page of this paperwork. If you have not heard from [place] regarding the results in 2 weeks, please contact this office.

## 2016-02-18 NOTE — Progress Notes (Signed)
Subjective:    Patient ID: Victor Nixon, male    DOB: 08-26-1966, 50 y.o.   MRN: [id number]  02/18/2016  Leg Pain   HPI This 50 y.o. male presents for evaluation of B leg pain; having unusual feeling in legs; intermittent sensation; feels like blood flowing to one area or another.  Onset a few months ago.  Particularly, feels tightness in medial calf; ?swelling intermittent in calf.  In last few day started aching in L knee and medial calf area.  Has noticed blood vessel prominence that is new; mild tenderness to palpation.  Worried about etiology. Does not interfere with activity or ability.  L>R leg.  No injury.  No lower back pain; no hip pain B; no buttocks region. Bothers mainly with ambulation; no pain upon awakening; worsens as day progresses. No swelling today; achy; proximal calf and knee; lateral calf can be involved.  Goes to dermatologist regularly; had concern about one ole and no issue.  No limping. Stairs no affect. Squatting has not affect.  Sales; sitting a lot.  No prolonged trips; no recent surgeries. No family hx of blood clots or clotting disorders.  Goes to dermatologist due to father with melanoma; goes to derm annually.  Did take Ibuprofen yesterday for pain.  No nighttime awakening.  Worried about malignancy.  No night sweats, fevers, unintentional weight loss, or fatigue/malaise. No regular exercise yet active with 45 year old son.   Review of Systems  Constitutional: Negative for fever, chills, diaphoresis, activity change, appetite change, fatigue and unexpected weight change.  Respiratory: Negative for cough and shortness of breath.   Cardiovascular: Negative for chest pain, palpitations and leg swelling.  Gastrointestinal: Negative for nausea, vomiting, abdominal pain and diarrhea.  Endocrine: Negative for cold intolerance, heat intolerance, polydipsia, polyphagia and polyuria.  Musculoskeletal: Positive for myalgias. Negative for back pain, joint swelling,  arthralgias and gait problem.  Skin: Negative for color change, pallor, rash and wound.  Neurological: Negative for dizziness, tremors, seizures, syncope, facial asymmetry, speech difficulty, weakness, light-headedness, numbness and headaches.  Psychiatric/Behavioral: Negative for sleep disturbance and dysphoric mood. The patient is not nervous/anxious.     Past Medical History  Diagnosis Date  . Chronic kidney disease     stones  . Allergy   . Heart murmur   . Hyperlipidemia    Past Surgical History  Procedure Laterality Date  . Hernia repair      age 82 or 28  . Nephrolithotomy  06/21/2012    Procedure: NEPHROLITHOTOMY PERCUTANEOUS;  Surgeon: Claybon Jabs, MD;  Location: [facility];  Service: Urology;  Laterality: Left;  left ureteral stent   Allergies  Allergen Reactions  . Cephalexin Rash   Current Outpatient Prescriptions  Medication Sig Dispense Refill  . fish oil-omega-3 fatty acids 1000 MG capsule Take 1 g by mouth daily.    Marland Kitchen ibuprofen (ADVIL,MOTRIN) 600 MG tablet Take 1 tablet (600 mg total) by mouth every 8 (eight) hours as needed. 30 tablet 0  . Multiple Vitamin (MULTIVITAMIN) tablet Take 1 tablet by mouth daily.    Marland Kitchen atorvastatin (LIPITOR) 40 MG tablet Take 1 tablet (40 mg total) by mouth daily. (Patient not taking: Reported on 10/12/2015) 30 tablet 5  . cyclobenzaprine (FLEXERIL) 5 MG tablet Take 1 tablet (5 mg total) by mouth 3 (three) times daily as needed for muscle spasms. (Patient not taking: Reported on 10/12/2015) 20 tablet 0   No current facility-administered medications for this visit.   Social History  Social History  . Marital Status: Married    Spouse Name: N/A  . Number of Children: N/A  . Years of Education: N/A   Occupational History  . Not on file.   Social History Main Topics  . Smoking status: Never Smoker   . Smokeless tobacco: Not on file  . Alcohol Use: 0.0 oz/week    1-2 Glasses of wine per week     Comment: occassional beer, liquor    . Drug Use: No  . Sexual Activity: Yes    Birth Control/ Protection: Abstinence   Other Topics Concern  . Not on file   Social History Narrative   Family History  Problem Relation Age of Onset  . Hip fracture Mother   . Cancer Father     melanoma  . Heart disease Father     heart valve replacement       Objective:    BP 122/82 mmHg  Pulse 73  Temp(Src) 98 F (36.7 C) (Oral)  Resp 16  Ht 5\' 10"  (1.778 m)  Wt 183 lb 12.8 oz (83.371 kg)  BMI 26.37 kg/m2  SpO2 98% Physical Exam  Constitutional: He is oriented to person, place, and time. He appears well-developed and well-nourished. No distress.  HENT:  Head: Normocephalic and atraumatic.  Right Ear: External ear normal.  Left Ear: External ear normal.  Nose: Nose normal.  Mouth/Throat: Oropharynx is clear and moist.  Eyes: Conjunctivae and EOM are normal. Pupils are equal, round, and reactive to light.  Neck: Normal range of motion. Neck supple. Carotid bruit is not present. No thyromegaly present.  Cardiovascular: Normal rate, regular rhythm, normal heart sounds and intact distal pulses.  Exam reveals no gallop and no friction rub.   No murmur heard. No palpable cords; Hommen's negative.  Pulmonary/Chest: Effort normal and breath sounds normal. He has no wheezes. He has no rales.  Abdominal: Soft. Bowel sounds are normal. He exhibits no distension and no mass. There is no tenderness. There is no rebound and no guarding.  Musculoskeletal:       Left knee: Normal. He exhibits normal range of motion, no swelling, no effusion, no ecchymosis and no bony tenderness. No tenderness found. No medial joint line, no lateral joint line, no MCL, no LCL and no patellar tendon tenderness noted.       Left ankle: Normal. He exhibits normal range of motion. No tenderness. No lateral malleolus and no medial malleolus tenderness found. Achilles tendon normal.       Lumbar back: Normal. He exhibits normal range of motion, no tenderness, no  bony tenderness, no swelling, no edema, no pain, no spasm and normal pulse.       Left lower leg: He exhibits tenderness. He exhibits no bony tenderness, no swelling, no edema, no deformity and no laceration.  +TTP distal aspect of calf.    Lymphadenopathy:    He has no cervical adenopathy.  Neurological: He is alert and oriented to person, place, and time. No cranial nerve deficit.  Skin: Skin is warm and dry. No rash noted. He is not diaphoretic.  +scattered superficial veins along L medial calf proximally and distally; non-tender.    Psychiatric: He has a normal mood and affect. His behavior is normal.  Nursing note and vitals reviewed.  Results for orders placed or performed in visit on 02/18/16  POCT CBC  Result Value Ref Range   WBC 5.0 4.6 - 10.2 K/uL   Lymph, poc 1.6 0.6 - 3.4  POC LYMPH PERCENT 32.1 10 - 50 %L   MID (cbc) 0.5 0 - 0.9   POC MID % 10.0 0 - 12 %M   POC Granulocyte 2.9 2 - 6.9   Granulocyte percent 57.9 37 - 80 %G   RBC 4.74 4.69 - 6.13 M/uL   Hemoglobin 15.2 14.1 - 18.1 g/dL   HCT, POC 41.7 (A) 43.5 - 53.7 %   MCV 88.0 80 - 97 fL   MCH, POC 32.1 (A) 27 - 31.2 pg   MCHC 36.5 (A) 31.8 - 35.4 g/dL   RDW, POC 13.1 %   Platelet Count, POC 341 142 - 424 K/uL   MPV 7.4 0 - 99.8 fL  POCT SEDIMENTATION RATE  Result Value Ref Range   POCT SED RATE 9 0 - 22 mm/hr       Assessment & Plan:   1. Calf pain, left   2. Family history of melanoma    -New. -Obtain labs. -refer for LE doppler. -benign exam in office.   -cannot rule out lumbar radiculopathy; knee exam benign and does not seem to be primary etiology to calf pain. -if LE doppler negative, obtain L tib-fib to rule out bony metastasis due to patient's concern of malignancy.   Orders Placed This Encounter  Procedures  . Comprehensive metabolic panel  . POCT CBC  . POCT SEDIMENTATION RATE   No orders of the defined types were placed in this encounter.    No Follow-up on file.    Lorance Pickeral  Elayne Guerin, M.D. [facility] [address] [place], [place]  [address] [phone] phone [phone] fax

## 2016-02-19 ENCOUNTER — Telehealth: Payer: Self-pay | Admitting: *Deleted

## 2016-02-19 ENCOUNTER — Ambulatory Visit (HOSPITAL_COMMUNITY)
Admission: RE | Admit: 2016-02-19 | Discharge: 2016-02-19 | Disposition: A | Discharge status: Home / Self Care | Payer: 59 | Source: Ambulatory Visit | Attending: Family Medicine | Admitting: Family Medicine

## 2016-02-19 DIAGNOSIS — M79662 Pain in left lower leg: Secondary | ICD-10-CM | POA: Diagnosis present

## 2016-02-19 NOTE — Progress Notes (Signed)
VASCULAR LAB PRELIMINARY  PRELIMINARY  PRELIMINARY  PRELIMINARY  Left lower extremity venous duplex completed.    Preliminary report:  Left:  No evidence of DVT, superficial thrombosis, or Baker's cyst.  Wilene Pharo, RVS 02/19/2016, 10:57 AM

## 2016-02-19 NOTE — Telephone Encounter (Signed)
Vascular lab called with pt doppler report.  It was negative.  What would you like for [place] to advise pt?

## 2016-02-20 MED ORDER — MELOXICAM 15 MG PO TABS: 15.0000 mg | ORAL_TABLET | Freq: Every day | ORAL | Status: DC

## 2016-02-20 NOTE — Telephone Encounter (Signed)
Spoke to pt and advised 

## 2016-02-20 NOTE — Telephone Encounter (Signed)
Call --- doppler negative for DVT.  No evidence of blood clot in leg as cause of calf pain.  Next step is to undergo further evaluation of leg pain.  I recommend referral to orthopedics for further evaluation. I have placed referral to ortho.  I also recommend trial of anti-inflammatory to treat any nerve inflammation or muscular inflammation that may be causing the pain.  I have sent in Meloxicam 15mg  one tablet daily; recommend taking medication daily until orthopedic evaluation. I also recommend finding exercises for sciatica and calf strain on line and performing daily until ortho evaluation.

## 2016-02-21 ENCOUNTER — Telehealth: Payer: Self-pay

## 2016-02-21 NOTE — Telephone Encounter (Signed)
Pt called to get lab results but also wants to discuss with Dr. Tamala Julian.  States he is doing some things he wants her to know - they did not discuss on his last visit.

## 2017-04-10 ENCOUNTER — Ambulatory Visit (INDEPENDENT_AMBULATORY_CARE_PROVIDER_SITE_OTHER): Payer: 59 | Admitting: Emergency Medicine

## 2017-04-10 ENCOUNTER — Encounter: Payer: Self-pay | Admitting: Emergency Medicine

## 2017-04-10 VITALS — BP 123/82 | HR 72 | Temp 98.2°F | Resp 16 | Ht 70.0 in | Wt 178.2 lb

## 2017-04-10 DIAGNOSIS — Z0000 Encounter for general adult medical examination without abnormal findings: Secondary | ICD-10-CM | POA: Diagnosis not present

## 2017-04-10 NOTE — Patient Instructions (Addendum)
   IF you received an x-ray today, you will receive an invoice from [facility]. Please contact [facility] at [phone] with questions or concerns regarding your invoice.   IF you received labwork today, you will receive an invoice from [facility]. Please contact [facility] at [phone] with questions or concerns regarding your invoice.   Our billing staff will not be able to assist you with questions regarding bills from these companies.  You will be contacted with the lab results as soon as they are available. The fastest way to get your results is to activate your My Chart account. Instructions are located on the last page of this paperwork. If you have not heard from us regarding the results in 2 weeks, please contact this office.        Health Maintenance, Male A healthy lifestyle and preventive care is important for your health and wellness. Ask your health care provider about what schedule of regular examinations is right for you. What should I know about weight and diet?  Eat a Healthy Diet  Eat plenty of vegetables, fruits, whole grains, low-fat dairy products, and lean protein.  Do not eat a lot of foods high in solid fats, added sugars, or salt. Maintain a Healthy Weight  Regular exercise can help you achieve or maintain a healthy weight. You should:  Do at least 150 minutes of exercise each week. The exercise should increase your heart rate and make you sweat (moderate-intensity exercise).  Do strength-training exercises at least twice a week. Watch Your Levels of Cholesterol and Blood Lipids  Have your blood tested for lipids and cholesterol every 5 years starting at 49 years of age. If you are at high risk for heart disease, you should start having your blood tested when you are 49 years old. You may need to have your cholesterol levels checked more often if:  Your lipid or cholesterol levels are high.  You are older than 50 years of  age.  You are at high risk for heart disease. What should I know about cancer screening? Many types of cancers can be detected early and may often be prevented. Lung Cancer  You should be screened every year for lung cancer if:  You are a current smoker who has smoked for at least 30 years.  You are a former smoker who has quit within the past 15 years.  Talk to your health care provider about your screening options, when you should start screening, and how often you should be screened. Colorectal Cancer  Routine colorectal cancer screening usually begins at 50 years of age and should be repeated every 5-10 years until you are 49 years old. You may need to be screened more often if early forms of precancerous polyps or small growths are found. Your health care provider may recommend screening at an earlier age if you have risk factors for colon cancer.  Your health care provider may recommend using home test kits to check for hidden blood in the stool.  A small camera at the end of a tube can be used to examine your colon (sigmoidoscopy or colonoscopy). This checks for the earliest forms of colorectal cancer. Prostate and Testicular Cancer  Depending on your age and overall health, your health care provider may do certain tests to screen for prostate and testicular cancer.  Talk to your health care provider about any symptoms or concerns you have about testicular or prostate cancer. Skin Cancer  Check your skin from head   to toe regularly.  Tell your health care provider about any new moles or changes in moles, especially if:  There is a change in a mole's size, shape, or color.  You have a mole that is larger than a pencil eraser.  Always use sunscreen. Apply sunscreen liberally and repeat throughout the day.  Protect yourself by wearing long sleeves, pants, a wide-brimmed hat, and sunglasses when outside. What should I know about heart disease, diabetes, and high blood  pressure?  If you are 18-39 years of age, have your blood pressure checked every 3-5 years. If you are 40 years of age or older, have your blood pressure checked every year. You should have your blood pressure measured twice-once when you are at a hospital or clinic, and once when you are not at a hospital or clinic. Record the average of the two measurements. To check your blood pressure when you are not at a hospital or clinic, you can use:  An automated blood pressure machine at a pharmacy.  A home blood pressure monitor.  Talk to your health care provider about your target blood pressure.  If you are between 45-79 years old, ask your health care provider if you should take aspirin to prevent heart disease.  Have regular diabetes screenings by checking your fasting blood sugar level.  If you are at a normal weight and have a low risk for diabetes, have this test once every three years after the age of 45.  If you are overweight and have a high risk for diabetes, consider being tested at a younger age or more often.  A one-time screening for abdominal aortic aneurysm (AAA) by ultrasound is recommended for men aged 65-75 years who are current or former smokers. What should I know about preventing infection? Hepatitis B  If you have a higher risk for hepatitis B, you should be screened for this virus. Talk with your health care provider to find out if you are at risk for hepatitis B infection. Hepatitis C  Blood testing is recommended for:  Everyone born from 1945 through 1965.  Anyone with known risk factors for hepatitis C. Sexually Transmitted Diseases (STDs)  You should be screened each year for STDs including gonorrhea and chlamydia if:  You are sexually active and are younger than 49 years of age.  You are older than 49 years of age and your health care provider tells you that you are at risk for this type of infection.  Your sexual activity has changed since you were last  screened and you are at an increased risk for chlamydia or gonorrhea. Ask your health care provider if you are at risk.  Talk with your health care provider about whether you are at high risk of being infected with HIV. Your health care provider may recommend a prescription medicine to help prevent HIV infection. What else can I do?  Schedule regular health, dental, and eye exams.  Stay current with your vaccines (immunizations).  Do not use any tobacco products, such as cigarettes, chewing tobacco, and e-cigarettes. If you need help quitting, ask your health care provider.  Limit alcohol intake to no more than 2 drinks per day. One drink equals 12 ounces of beer, 5 ounces of wine, or 1 ounces of hard liquor.  Do not use street drugs.  Do not share needles.  Ask your health care provider for help if you need support or information about quitting drugs.  Tell your health care provider if you   often feel depressed.  Tell your health care provider if you have ever been abused or do not feel safe at home. This information is not intended to replace advice given to you by your health care provider. Make sure you discuss any questions you have with your health care provider. Document Released: 05/08/2008 Document Revised: 07/09/2016 Document Reviewed: 08/14/2015 Elsevier Interactive Patient Education  2017 [facility]) Exercise Recommendation  Being physically active is important to prevent heart disease and stroke, the nation's No. 1and No. 5killers. To improve overall cardiovascular health, we suggest at least 150 minutes per week of moderate exercise or 75 minutes per week of vigorous exercise (or a combination of moderate and vigorous activity). Thirty minutes a day, five times a week is an easy goal to remember. You will also experience benefits even if you divide your time into two or three segments of 10 to 15 minutes per day.  For people who would  benefit from lowering their blood pressure or cholesterol, we recommend 40 minutes of aerobic exercise of moderate to vigorous intensity three to four times a week to lower the risk for heart attack and stroke.  Physical activity is anything that makes you move your body and burn calories.  This includes things like climbing stairs or playing sports. Aerobic exercises benefit your heart, and include walking, jogging, swimming or biking. Strength and stretching exercises are best for overall stamina and flexibility.  The simplest, positive change you can make to effectively improve your heart health is to start walking. It's enjoyable, free, easy, social and great exercise. A walking program is flexible and boasts high success rates because people can stick with it. It's easy for walking to become a regular and satisfying part of life.   For Overall Cardiovascular Health:  At least 30 minutes of moderate-intensity aerobic activity at least 5 days per week for a total of 150  OR   At least 25 minutes of vigorous aerobic activity at least 3 days per week for a total of 75 minutes; or a combination of moderate- and vigorous-intensity aerobic activity  AND   Moderate- to high-intensity muscle-strengthening activity at least 2 days per week for additional health benefits.  For Lowering Blood Pressure and Cholesterol  An average 40 minutes of moderate- to vigorous-intensity aerobic activity 3 or 4 times per week  What if I can't make it to the time goal? Something is always better than nothing! And everyone has to start somewhere. Even if you've been sedentary for years, today is the day you can begin to make healthy changes in your life. If you don't think you'll make it for 30 or 40 minutes, set a reachable goal for today. You can work up toward your overall goal by increasing your time as you get stronger. Don't let all-or-nothing thinking rob you of doing what you can every day.   Source:http://www.heart.org

## 2017-04-10 NOTE — Progress Notes (Signed)
Patient ID: Victor Nixon, male    DOB: 09-Feb-1966, 51 y.o.   MRN: [id number]  PCP: Horald Pollen, MD  Chief Complaint  Patient presents with  . Annual Exam    Subjective:   Presents for [employer].  Asking about colonoscopy; no FHx of colon cancer; also has some chronic soreness to left side of throat.    Patient Active Problem List   Diagnosis Date Noted  . Family history of melanoma 02/18/2016  . Renal calculus, left 06/21/2012    Past Medical History:  Diagnosis Date  . Allergy   . Chronic kidney disease    stones  . Heart murmur   . Hyperlipidemia      Prior to Admission medications   Medication Sig Start Date End Date Taking? Authorizing Provider  fish oil-omega-3 fatty acids 1000 MG capsule Take 1 g by mouth daily.   Yes [provider]  ibuprofen (ADVIL,MOTRIN) 600 MG tablet Take 1 tablet (600 mg total) by mouth every 8 (eight) hours as needed. 10/12/15  Yes Brewington, Tishira R, PA-C  Multiple Vitamin (MULTIVITAMIN) tablet Take 1 tablet by mouth daily.   Yes [provider]  atorvastatin (LIPITOR) 40 MG tablet Take 1 tablet (40 mg total) by mouth daily. Patient not taking: Reported on 10/12/2015 02/11/14   Darlyne Russian, MD  cyclobenzaprine (FLEXERIL) 5 MG tablet Take 1 tablet (5 mg total) by mouth 3 (three) times daily as needed for muscle spasms. Patient not taking: Reported on 10/12/2015 06/20/13   Mancel Bale, PA-C  meloxicam (MOBIC) 15 MG tablet Take 1 tablet (15 mg total) by mouth daily. Patient not taking: Reported on 04/10/2017 02/20/16   Wardell Honour, MD    Allergies  Allergen Reactions  . Cephalexin Rash    Past Surgical History:  Procedure Laterality Date  . HERNIA REPAIR     age 37 or 14  . NEPHROLITHOTOMY  06/21/2012   Procedure: NEPHROLITHOTOMY PERCUTANEOUS;  Surgeon: Claybon Jabs, MD;  Location: [facility];  Service: Urology;  Laterality: Left;  left ureteral stent    Family History  Problem  Relation Age of Onset  . Hip fracture Mother   . Cancer Father        melanoma  . Heart disease Father        heart valve replacement    Social History   Social History  . Marital status: Married    Spouse name: N/A  . Number of children: N/A  . Years of education: N/A   Social History Main Topics  . Smoking status: Never Smoker  . Smokeless tobacco: Never Used  . Alcohol use 0.0 oz/week    1 - 2 Glasses of wine per week     Comment: occassional beer, liquor  . Drug use: No  . Sexual activity: Yes    Birth control/ protection: Abstinence   Other Topics Concern  . None   Social History Narrative  . None       Review of Systems  Constitutional: Negative.  Negative for activity change, appetite change, fatigue, fever and unexpected weight change.  HENT: Positive for sore throat (1-3 months; will refer to ENT). Negative for congestion, ear pain, hearing loss, mouth sores, nosebleeds, sinus pain, sinus pressure and trouble swallowing.   Eyes: Negative.  Negative for pain, discharge and itching.  Respiratory: Negative.  Negative for cough, chest tightness and shortness of breath.   Cardiovascular: Negative.  Negative for chest pain,  palpitations and leg swelling.  Gastrointestinal: Negative.  Negative for abdominal pain, blood in stool, diarrhea, nausea, rectal pain and vomiting.  Endocrine: Negative.  Negative for polydipsia, polyphagia and polyuria.  Genitourinary: Negative.  Negative for flank pain and hematuria.  Musculoskeletal: Negative.  Negative for back pain, myalgias and neck pain.  Skin: Negative.  Negative for rash.  Allergic/Immunologic: Negative.   Neurological: Negative.  Negative for dizziness and headaches.  Hematological: Negative.  Negative for adenopathy.  All other systems reviewed and are negative.   Vitals:   04/10/17 0925  BP: 123/82  Pulse: 72  Resp: 16  Temp: 98.2 F (36.8 C)       Objective:  Physical Exam  Constitutional: He is  oriented to person, place, and time. He appears well-developed and well-nourished.  HENT:  Head: Normocephalic and atraumatic.  Right Ear: External ear normal.  Left Ear: External ear normal.  Nose: Nose normal.  Mouth/Throat: Oropharynx is clear and moist. No oropharyngeal exudate.  Eyes: Conjunctivae and EOM are normal. Pupils are equal, round, and reactive to light.  Neck: Normal range of motion. Neck supple. No JVD present. No thyromegaly present.  Cardiovascular: Normal rate, regular rhythm, normal heart sounds and intact distal pulses.   No murmur heard. Pulmonary/Chest: Effort normal and breath sounds normal. No respiratory distress.  Abdominal: Soft. Bowel sounds are normal. He exhibits no distension. There is no tenderness.  Musculoskeletal: Normal range of motion. He exhibits no edema, tenderness or deformity.  Lymphadenopathy:    He has no cervical adenopathy.  Neurological: He is alert and oriented to person, place, and time. He exhibits normal muscle tone. Coordination normal.  Skin: Skin is warm and dry.  Psychiatric: He has a normal mood and affect. His behavior is normal.  Vitals reviewed.          Assessment & Plan:  Cote was seen today for annual exam.  Diagnoses and all orders for this visit:  Routine general medical examination at a health care facility -     CBC with Differential -     Comprehensive metabolic panel -     Hemoglobin A1c -     Lipid panel -     TSH -     HIV antibody -     Ambulatory referral to ENT -     Ambulatory referral to Gastroenterology -     Testosterone    Patient Instructions       IF you received an x-ray today, you will receive an invoice from [facility]. Please contact [facility] at [phone] with questions or concerns regarding your invoice.   IF you received labwork today, you will receive an invoice from [facility]. Please contact [facility] at [phone] with questions or concerns  regarding your invoice.   Our billing staff will not be able to assist you with questions regarding bills from these companies.  You will be contacted with the lab results as soon as they are available. The fastest way to get your results is to activate your My Chart account. Instructions are located on the last page of this paperwork. If you have not heard from [place] regarding the results in 2 weeks, please contact this office.        Health Maintenance, Male A healthy lifestyle and preventive care is important for your health and wellness. Ask your health care provider about what schedule of regular examinations is right for you. What should I know about weight and diet?  Eat a  Healthy Diet  Eat plenty of vegetables, fruits, whole grains, low-fat dairy products, and lean protein.  Do not eat a lot of foods high in solid fats, added sugars, or salt. Maintain a Healthy Weight  Regular exercise can help you achieve or maintain a healthy weight. You should:  Do at least 150 minutes of exercise each week. The exercise should increase your heart rate and make you sweat (moderate-intensity exercise).  Do strength-training exercises at least twice a week. Watch Your Levels of Cholesterol and Blood Lipids  Have your blood tested for lipids and cholesterol every 5 years starting at 49 years of age. If you are at high risk for heart disease, you should start having your blood tested when you are 50 years old. You may need to have your cholesterol levels checked more often if:  Your lipid or cholesterol levels are high.  You are older than 49 years of age.  You are at high risk for heart disease. What should I know about cancer screening? Many types of cancers can be detected early and may often be prevented. Lung Cancer  You should be screened every year for lung cancer if:  You are a current smoker who has smoked for at least 30 years.  You are a former smoker who has quit within the  past 15 years.  Talk to your health care provider about your screening options, when you should start screening, and how often you should be screened. Colorectal Cancer  Routine colorectal cancer screening usually begins at 49 years of age and should be repeated every 5-10 years until you are 49 years old. You may need to be screened more often if early forms of precancerous polyps or small growths are found. Your health care provider may recommend screening at an earlier age if you have risk factors for colon cancer.  Your health care provider may recommend using home test kits to check for hidden blood in the stool.  A small camera at the end of a tube can be used to examine your colon (sigmoidoscopy or colonoscopy). This checks for the earliest forms of colorectal cancer. Prostate and Testicular Cancer  Depending on your age and overall health, your health care provider may do certain tests to screen for prostate and testicular cancer.  Talk to your health care provider about any symptoms or concerns you have about testicular or prostate cancer. Skin Cancer  Check your skin from head to toe regularly.  Tell your health care provider about any new moles or changes in moles, especially if:  There is a change in a mole's size, shape, or color.  You have a mole that is larger than a pencil eraser.  Always use sunscreen. Apply sunscreen liberally and repeat throughout the day.  Protect yourself by wearing long sleeves, pants, a wide-brimmed hat, and sunglasses when outside. What should I know about heart disease, diabetes, and high blood pressure?  If you are 23-57 years of age, have your blood pressure checked every 3-5 years. If you are 26 years of age or older, have your blood pressure checked every year. You should have your blood pressure measured twice-once when you are at a hospital or clinic, and once when you are not at a hospital or clinic. Record the average of the two  measurements. To check your blood pressure when you are not at a hospital or clinic, you can use:  An automated blood pressure machine at a pharmacy.  A home blood pressure  monitor.  Talk to your health care provider about your target blood pressure.  If you are between 29-68 years old, ask your health care provider if you should take aspirin to prevent heart disease.  Have regular diabetes screenings by checking your fasting blood sugar level.  If you are at a normal weight and have a low risk for diabetes, have this test once every three years after the age of 36.  If you are overweight and have a high risk for diabetes, consider being tested at a younger age or more often.  A one-time screening for abdominal aortic aneurysm (AAA) by ultrasound is recommended for men aged 2-75 years who are current or former smokers. What should I know about preventing infection? Hepatitis B  If you have a higher risk for hepatitis B, you should be screened for this virus. Talk with your health care provider to find out if you are at risk for hepatitis B infection. Hepatitis C  Blood testing is recommended for:  Everyone born from [birth year] through 1963.  Anyone with known risk factors for hepatitis C. Sexually Transmitted Diseases (STDs)  You should be screened each year for STDs including gonorrhea and chlamydia if:  You are sexually active and are younger than 49 years of age.  You are older than 49 years of age and your health care provider tells you that you are at risk for this type of infection.  Your sexual activity has changed since you were last screened and you are at an increased risk for chlamydia or gonorrhea. Ask your health care provider if you are at risk.  Talk with your health care provider about whether you are at high risk of being infected with HIV. Your health care provider may recommend a prescription medicine to help prevent HIV infection. What else can I do?  Schedule  regular health, dental, and eye exams.  Stay current with your vaccines (immunizations).  Do not use any tobacco products, such as cigarettes, chewing tobacco, and e-cigarettes. If you need help quitting, ask your health care provider.  Limit alcohol intake to no more than 2 drinks per day. One drink equals 12 ounces of beer, 5 ounces of wine, or 1 ounces of hard liquor.  Do not use street drugs.  Do not share needles.  Ask your health care provider for help if you need support or information about quitting drugs.  Tell your health care provider if you often feel depressed.  Tell your health care provider if you have ever been abused or do not feel safe at home. This information is not intended to replace advice given to you by your health care provider. Make sure you discuss any questions you have with your health care provider. Document Released: 05/08/2008 Document Revised: 07/09/2016 Document Reviewed: 08/14/2015 Elsevier Interactive Patient Education  2017 [facility]) Exercise Recommendation  Being physically active is important to prevent heart disease and stroke, the nation's No. 1and No. 5killers. To improve overall cardiovascular health, we suggest at least 150 minutes per week of moderate exercise or 75 minutes per week of vigorous exercise (or a combination of moderate and vigorous activity). Thirty minutes a day, five times a week is an easy goal to remember. You will also experience benefits even if you divide your time into two or three segments of 10 to 15 minutes per day.  For people who would benefit from lowering their blood pressure or cholesterol, we recommend 40 minutes of  aerobic exercise of moderate to vigorous intensity three to four times a week to lower the risk for heart attack and stroke.  Physical activity is anything that makes you move your body and burn calories.  This includes things like climbing stairs or playing  sports. Aerobic exercises benefit your heart, and include walking, jogging, swimming or biking. Strength and stretching exercises are best for overall stamina and flexibility.  The simplest, positive change you can make to effectively improve your heart health is to start walking. It's enjoyable, free, easy, social and great exercise. A walking program is flexible and boasts high success rates because people can stick with it. It's easy for walking to become a regular and satisfying part of life.   For Overall Cardiovascular Health:  At least 30 minutes of moderate-intensity aerobic activity at least 5 days per week for a total of 150  OR   At least 25 minutes of vigorous aerobic activity at least 3 days per week for a total of 75 minutes; or a combination of moderate- and vigorous-intensity aerobic activity  AND   Moderate- to high-intensity muscle-strengthening activity at least 2 days per week for additional health benefits.  For Lowering Blood Pressure and Cholesterol  An average 40 minutes of moderate- to vigorous-intensity aerobic activity 3 or 4 times per week  What if I can't make it to the time goal? Something is always better than nothing! And everyone has to start somewhere. Even if you've been sedentary for years, today is the day you can begin to make healthy changes in your life. If you don't think you'll make it for 30 or 40 minutes, set a reachable goal for today. You can work up toward your overall goal by increasing your time as you get stronger. Don't let all-or-nothing thinking rob you of doing what you can every day.  Source:http://www.heart.org

## 2017-04-11 LAB — HEMOGLOBIN A1C
Est. average glucose Bld gHb Est-mCnc: 100 mg/dL
Hgb A1c MFr Bld: 5.1 % (ref 4.8–5.6)

## 2017-04-11 LAB — CBC WITH DIFFERENTIAL/PLATELET
BASOS: 1 %
Basophils Absolute: 0 10*3/uL (ref 0.0–0.2)
EOS (ABSOLUTE): 0.2 10*3/uL (ref 0.0–0.4)
EOS: 3 %
HEMOGLOBIN: 14.7 g/dL (ref 13.0–17.7)
Hematocrit: 44 % (ref 37.5–51.0)
IMMATURE GRANS (ABS): 0 10*3/uL (ref 0.0–0.1)
IMMATURE GRANULOCYTES: 0 %
LYMPHS: 29 %
Lymphocytes Absolute: 1.7 10*3/uL (ref 0.7–3.1)
MCH: 29.9 pg (ref 26.6–33.0)
MCHC: 33.4 g/dL (ref 31.5–35.7)
MCV: 89 fL (ref 79–97)
MONOCYTES: 10 %
Monocytes Absolute: 0.5 10*3/uL (ref 0.1–0.9)
NEUTROS ABS: 3.3 10*3/uL (ref 1.4–7.0)
NEUTROS PCT: 57 %
PLATELETS: 390 10*3/uL — AB (ref 150–379)
RBC: 4.92 x10E6/uL (ref 4.14–5.80)
RDW: 13.6 % (ref 12.3–15.4)
WBC: 5.7 10*3/uL (ref 3.4–10.8)

## 2017-04-11 LAB — LIPID PANEL
CHOL/HDL RATIO: 3.3 ratio (ref 0.0–5.0)
CHOLESTEROL TOTAL: 281 mg/dL — AB (ref 100–199)
HDL: 85 mg/dL (ref >39)
LDL Calculated: 182 mg/dL — ABNORMAL HIGH (ref 0–99)
TRIGLYCERIDES: 71 mg/dL (ref 0–149)
VLDL CHOLESTEROL CAL: 14 mg/dL (ref 5–40)

## 2017-04-11 LAB — HIV ANTIBODY (ROUTINE TESTING W REFLEX): HIV Screen 4th Generation wRfx: NONREACTIVE

## 2017-04-11 LAB — COMPREHENSIVE METABOLIC PANEL
A/G RATIO: 1.8 (ref 1.2–2.2)
ALBUMIN: 4.8 g/dL (ref 3.5–5.5)
ALT: 12 IU/L (ref 0–44)
AST: 16 IU/L (ref 0–40)
Alkaline Phosphatase: 61 IU/L (ref 39–117)
BILIRUBIN TOTAL: 0.6 mg/dL (ref 0.0–1.2)
BUN / CREAT RATIO: 17 (ref 9–20)
BUN: 16 mg/dL (ref 6–24)
CALCIUM: 9.7 mg/dL (ref 8.7–10.2)
CHLORIDE: 99 mmol/L (ref 96–106)
CO2: 24 mmol/L (ref 18–29)
Creatinine, Ser: 0.92 mg/dL (ref 0.76–1.27)
GFR, EST AFRICAN AMERICAN: 113 mL/min/{1.73_m2} (ref >59)
GFR, EST NON AFRICAN AMERICAN: 98 mL/min/{1.73_m2} (ref >59)
Globulin, Total: 2.7 g/dL (ref 1.5–4.5)
Glucose: 92 mg/dL (ref 65–99)
POTASSIUM: 5 mmol/L (ref 3.5–5.2)
Sodium: 139 mmol/L (ref 134–144)
TOTAL PROTEIN: 7.5 g/dL (ref 6.0–8.5)

## 2017-04-11 LAB — TSH: TSH: 1.62 u[IU]/mL (ref 0.450–4.500)

## 2017-04-11 LAB — TESTOSTERONE: Testosterone: 455 ng/dL (ref 264–916)

## 2017-04-11 NOTE — Progress Notes (Signed)
The cholesterol is elevated - see lab result. Send the patient a copy of the test result with a written copy of a low fat, low cholesterol diet. Repeat test in 6-12 months.

## 2017-04-15 ENCOUNTER — Encounter: Payer: Self-pay | Admitting: Gastroenterology

## 2017-04-24 ENCOUNTER — Ambulatory Visit (INDEPENDENT_AMBULATORY_CARE_PROVIDER_SITE_OTHER): Payer: 59 | Admitting: Gastroenterology

## 2017-04-24 ENCOUNTER — Encounter: Payer: Self-pay | Admitting: Gastroenterology

## 2017-04-24 VITALS — BP 118/70 | HR 72 | Ht 70.0 in | Wt 180.0 lb

## 2017-04-24 DIAGNOSIS — Z1211 Encounter for screening for malignant neoplasm of colon: Secondary | ICD-10-CM

## 2017-04-24 DIAGNOSIS — Z1212 Encounter for screening for malignant neoplasm of rectum: Secondary | ICD-10-CM

## 2017-04-24 MED ORDER — NA SULFATE-K SULFATE-MG SULF 17.5-3.13-1.6 GM/177ML PO SOLN: 1.0000 | ORAL | 0 refills | Status: DC

## 2017-04-24 NOTE — Progress Notes (Signed)
04/24/2017 EDISON NICHOLSON [id number] 11/17/1966   HISTORY OF PRESENT ILLNESS:  This is a 51 year old male who is new to our practice.  He was referred here by his PCP, Dr. Mitchel Honour, to discuss colonoscopy.  He saw the new American Cancer Society guidelines and is interested in scheduling a colonoscopy.  While he is here he mentions that he has recently noticed some rectal/anal discomfort recently.  Describes it as maybe a pressure sensation.  Also says that BM's seem a little more difficult to produce, but he would not consider himself "constipated" per se.  Says that typically he would not have even mentioned these issues, but it just seems that they have been persistent recently and is definitely a change for him.  Denies any abdominal pain or rectal bleeding.   Past Medical History:  Diagnosis Date  . Allergy   . Chronic kidney disease    stones  . Heart murmur   . Hyperlipidemia    Past Surgical History:  Procedure Laterality Date  . HERNIA REPAIR     age 41 or 42  . NEPHROLITHOTOMY  06/21/2012   Procedure: NEPHROLITHOTOMY PERCUTANEOUS;  Surgeon: Claybon Jabs, MD;  Location: [facility];  Service: Urology;  Laterality: Left;  left ureteral stent    reports that he has never smoked. He has never used smokeless tobacco. He reports that he drinks alcohol. He reports that he does not use drugs. family history includes Cancer in his father; Heart disease in his father; Hip fracture in his mother. Allergies  Allergen Reactions  . Cephalexin Rash      Outpatient Encounter Prescriptions as of 04/24/2017  Medication Sig  . fish oil-omega-3 fatty acids 1000 MG capsule Take 1 g by mouth daily.  . Multiple Vitamin (MULTIVITAMIN) tablet Take 1 tablet by mouth daily.  . [DISCONTINUED] atorvastatin (LIPITOR) 40 MG tablet Take 1 tablet (40 mg total) by mouth daily. (Patient not taking: Reported on 10/12/2015)  . [DISCONTINUED] cyclobenzaprine (FLEXERIL) 5 MG tablet Take 1 tablet (5 mg total)  by mouth 3 (three) times daily as needed for muscle spasms. (Patient not taking: Reported on 10/12/2015)  . [DISCONTINUED] ibuprofen (ADVIL,MOTRIN) 600 MG tablet Take 1 tablet (600 mg total) by mouth every 8 (eight) hours as needed.  . [DISCONTINUED] meloxicam (MOBIC) 15 MG tablet Take 1 tablet (15 mg total) by mouth daily. (Patient not taking: Reported on 04/10/2017)   No facility-administered encounter medications on file as of 04/24/2017.      REVIEW OF SYSTEMS  : All other systems reviewed and negative except where noted in the History of Present Illness.   PHYSICAL EXAM: BP 118/70   Pulse 72   Ht 5\' 10"  (1.778 m)   Wt 180 lb (81.6 kg)   BMI 25.83 kg/m  General: Well developed white male in no acute distress Head: Normocephalic and atraumatic Eyes:  Sclerae anicteric, conjunctiva pink. Ears: Normal auditory acuity Lungs: Clear throughout to auscultation; no increased WOB Heart: Regular rate and rhythm. Abdomen: Soft, non-distended.  BS present.  Non-tender. Rectal:  No external abnormalities noted.  DRE revealed no masses or tenderness.  Hemoccult negative. Musculoskeletal: Symmetrical with no gross deformities  Skin: No lesions on visible extremities Extremities: No edema  Neurological: Alert oriented x 4, grossly non-focal Psychological:  Alert and cooperative. Normal mood and affect  ASSESSMENT AND PLAN: -Screening colonoscopy:  Here with interest in early screening at the recommendation of the new American Cancer Society guidelines.  Will  schedule for colonoscopy with Dr. Henrene Pastor. -Rectal/anal discomfort:  Intermittent.  No abnormalities/nothing significant noted on exam/DRE today.  *The risks, benefits, and alternatives to colonoscopy were discussed with the patient and he consents to proceed.   CC:  Victor Nixon, [place]

## 2017-04-24 NOTE — Patient Instructions (Signed)

## 2017-04-28 DIAGNOSIS — Z1211 Encounter for screening for malignant neoplasm of colon: Secondary | ICD-10-CM | POA: Insufficient documentation

## 2017-04-28 NOTE — Progress Notes (Signed)
Assessment and plans review 

## 2017-07-07 ENCOUNTER — Encounter: Payer: 59 | Admitting: Internal Medicine

## 2017-09-01 ENCOUNTER — Encounter: Payer: 59 | Admitting: Internal Medicine

## 2017-12-08 ENCOUNTER — Encounter: Payer: 59 | Admitting: Internal Medicine

## 2018-07-02 ENCOUNTER — Ambulatory Visit (INDEPENDENT_AMBULATORY_CARE_PROVIDER_SITE_OTHER): Payer: 59 | Admitting: Physician Assistant

## 2018-07-02 ENCOUNTER — Other Ambulatory Visit: Payer: Self-pay

## 2018-07-02 ENCOUNTER — Encounter: Payer: Self-pay | Admitting: Physician Assistant

## 2018-07-02 ENCOUNTER — Ambulatory Visit (INDEPENDENT_AMBULATORY_CARE_PROVIDER_SITE_OTHER): Payer: 59

## 2018-07-02 VITALS — BP 116/74 | HR 75 | Temp 98.1°F | Resp 16 | Ht 70.67 in | Wt 181.0 lb

## 2018-07-02 DIAGNOSIS — S93502A Unspecified sprain of left great toe, initial encounter: Secondary | ICD-10-CM | POA: Diagnosis not present

## 2018-07-02 DIAGNOSIS — M79675 Pain in left toe(s): Secondary | ICD-10-CM

## 2018-07-02 NOTE — Progress Notes (Signed)
Victor Nixon  MRN: [id number] DOB: 07-10-1966  PCP: Horald Pollen, MD  Chief Complaint  Patient presents with  . Toe Pain    big toe on the left foot right side has been swollen and painful x 3 weeks     Subjective:  Pt presents to clinic for he fell off a boat about 3 weeks ago.  Left big toe had immediate swelling and pain he is unsure what he did during the fall.  The pain overall has improved significantly but he wants to make sure there is nothing wrong.  He has noted that he is changed his gait to reduce pressure on his left big toe while walking.  This is created some discomfort in his ankle and shin area.  History is obtained by patient.  Review of Systems  Musculoskeletal: Positive for gait problem (Changed gait due to pain).    Patient Active Problem List   Diagnosis Date Noted  . Special screening for malignant neoplasms, colon 04/28/2017  . Routine general medical examination at a health care facility 04/10/2017  . Family history of melanoma 02/18/2016  . Renal calculus, left 06/21/2012    Current Outpatient Medications on File Prior to Visit  Medication Sig Dispense Refill  . fish oil-omega-3 fatty acids 1000 MG capsule Take 1 g by mouth daily.    . Multiple Vitamin (MULTIVITAMIN) tablet Take 1 tablet by mouth daily.    . Na Sulfate-K Sulfate-Mg Sulf 17.5-3.13-1.6 GM/180ML SOLN Take 1 kit by mouth as directed. 354 mL 0   No current facility-administered medications on file prior to visit.     Allergies  Allergen Reactions  . Cephalexin Rash    Past Medical History:  Diagnosis Date  . Allergy   . Chronic kidney disease    stones  . Heart murmur   . Hyperlipidemia    Social History   Social History Narrative  . Not on file   Social History   Tobacco Use  . Smoking status: Never Smoker  . Smokeless tobacco: Never Used  Substance Use Topics  . Alcohol use: Yes    Alcohol/week: 1.0 - 2.0 standard drinks    Types: 1 - 2 Glasses of  wine per week    Comment: occassional beer, liquor  . Drug use: No   family history includes Cancer in his father; Heart disease in his father; Hip fracture in his mother.     Objective:  BP 116/74   Pulse 75   Temp 98.1 F (36.7 C) (Oral)   Resp 16   Ht 5' 10.67" (1.795 m)   Wt 181 lb (82.1 kg)   SpO2 97%   BMI 25.48 kg/m  Body mass index is 25.48 kg/m.  Wt Readings from Last 3 Encounters:  07/02/18 181 lb (82.1 kg)  04/24/17 180 lb (81.6 kg)  04/10/17 178 lb 3.2 oz (80.8 kg)    Physical Exam  Constitutional: He is oriented to person, place, and time. He appears well-developed and well-nourished.  HENT:  Head: Normocephalic and atraumatic.  Right Ear: External ear normal.  Left Ear: External ear normal.  Eyes: Conjunctivae are normal.  Neck: Normal range of motion.  Pulmonary/Chest: Effort normal.  Musculoskeletal:       Feet:  Neurological: He is alert and oriented to person, place, and time.  Skin: Skin is warm and dry.  Psychiatric: Judgment normal.  Vitals reviewed.    Dg Foot 2 Views Left  Result Date: 07/02/2018 CLINICAL DATA:  Left great toe pain after injury 3 weeks ago. EXAM: LEFT FOOT - 2 VIEW COMPARISON:  None. FINDINGS: There is no evidence of fracture or dislocation. There is no evidence of arthropathy or other focal bone abnormality. Soft tissues are unremarkable. IMPRESSION: Normal left foot. Electronically Signed   By: Marijo Conception, M.D.   On: 07/02/2018 10:07     Assessment and Plan :  Great toe pain, left - Plan: DG Foot 2 Views Left  Sprain of left great toe, initial encounter -fracture suspect sprain, encourage supportive shoe during the day elevation and ice in the evening.  Patient is improving suspect to continue improvement over the next several weeks.  This will likely take a little bit longer as discussed with patient due to the fact that he continues to be weightbearing which is okay in the situation.  Patient verbalized to me that  they understand the following: diagnosis, what is being done for them, what to expect and what should be done at home.  Their questions have been answered.  See after visit summary for patient specific instructions.  Windell Hummingbird PA-C  [facility] 07/02/2018 10:18 AM  Please note: Portions of this report may have been transcribed using dragon voice recognition software. Every effort was made to ensure accuracy; however, inadvertent computerized transcription errors may be present.

## 2018-07-02 NOTE — Patient Instructions (Signed)
     IF you received an x-ray today, you will receive an invoice from [facility]. Please contact [facility] at [phone] with questions or concerns regarding your invoice.   IF you received labwork today, you will receive an invoice from [facility]. Please contact [facility] at [phone] with questions or concerns regarding your invoice.   Our billing staff will not be able to assist you with questions regarding bills from these companies.  You will be contacted with the lab results as soon as they are available. The fastest way to get your results is to activate your My Chart account. Instructions are located on the last page of this paperwork. If you have not heard from us regarding the results in 2 weeks, please contact this office.     

## 2018-09-30 ENCOUNTER — Encounter: Payer: Self-pay | Admitting: Internal Medicine

## 2018-09-30 ENCOUNTER — Ambulatory Visit (AMBULATORY_SURGERY_CENTER): Payer: Self-pay | Admitting: *Deleted

## 2018-09-30 VITALS — Ht 70.0 in | Wt 181.6 lb

## 2018-09-30 DIAGNOSIS — Z1211 Encounter for screening for malignant neoplasm of colon: Secondary | ICD-10-CM

## 2018-09-30 MED ORDER — NA SULFATE-K SULFATE-MG SULF 17.5-3.13-1.6 GM/177ML PO SOLN: 1.0000 | Freq: Once | ORAL | 0 refills | Status: AC

## 2018-09-30 NOTE — Progress Notes (Signed)
Denies allergies to eggs or soy products. Denies complications with sedation or anesthesia. Denies O2 use. Denies use of diet or weight loss medications.  Emmi instructions given for colonoscopy.  

## 2018-10-14 ENCOUNTER — Encounter: Payer: 59 | Admitting: Internal Medicine

## 2019-01-10 ENCOUNTER — Ambulatory Visit (INDEPENDENT_AMBULATORY_CARE_PROVIDER_SITE_OTHER): Payer: 59 | Admitting: Emergency Medicine

## 2019-01-10 ENCOUNTER — Other Ambulatory Visit: Payer: Self-pay

## 2019-01-10 ENCOUNTER — Encounter: Payer: Self-pay | Admitting: Emergency Medicine

## 2019-01-10 ENCOUNTER — Telehealth: Payer: Self-pay

## 2019-01-10 VITALS — BP 124/79 | HR 65 | Temp 98.5°F | Ht 70.0 in | Wt 183.0 lb

## 2019-01-10 DIAGNOSIS — M79605 Pain in left leg: Secondary | ICD-10-CM | POA: Insufficient documentation

## 2019-01-10 DIAGNOSIS — R202 Paresthesia of skin: Secondary | ICD-10-CM | POA: Insufficient documentation

## 2019-01-10 DIAGNOSIS — R21 Rash and other nonspecific skin eruption: Secondary | ICD-10-CM | POA: Diagnosis not present

## 2019-01-10 NOTE — Patient Instructions (Signed)
Paresthesia Paresthesia is a burning or prickling feeling. This feeling can happen in any part of the body. It often happens in the hands, arms, legs, or feet. Usually, it is not painful. In most cases, the feeling goes away in a short time and is not a sign of a serious problem. If you have paresthesia that lasts a long time, you may need to be seen by your doctor. Follow these instructions at home: Alcohol use   Do not drink alcohol if: ? Your doctor tells you not to drink. ? You are pregnant, may be pregnant, or are planning to become pregnant.  If you drink alcohol, limit how much you have: ? 0-1 drink a day for women. ? 0-2 drinks a day for men.  Be aware of how much alcohol is in your drink. In the [place] one drink equals one typical bottle of beer (12 oz), one-half glass of wine (5 oz), or one shot of hard liquor (1 oz). Nutrition  Eat a healthy diet. This includes: ? Eating foods that have a lot of fiber in them, such as fresh fruits and vegetables, whole grains, and beans. ? Limiting foods that have a lot of fat and processed sugars in them, such as fried or sweet foods. General instructions  Take over-the-counter and prescription medicines only as told by your doctor.  Do not use any products that have nicotine or tobacco in them, such as cigarettes and e-cigarettes. If you need help quitting, ask your doctor.  If you have diabetes, work with your doctor to make sure your blood sugar stays in a healthy range.  If your feet feel numb: ? Check for redness, warmth, and swelling every day. ? Wear padded socks and comfortable shoes. These help protect your feet.  Keep all follow-up visits as told by your doctor. This is important. Contact a doctor if:  You have paresthesia that gets worse or does not go away.  Your burning or prickling feeling gets worse when you walk.  You have pain or cramps.  You feel dizzy.  You have a rash. Get help right away if you:  Feel  weak.  Have trouble walking or moving.  Have problems speaking, understanding, or seeing.  Feel confused.  Cannot control when you pee (urinate) or poop (have a bowel movement).  Lose feeling (have numbness) after an injury.  Have new weakness in an arm or leg.  Pass out (faint). Summary  Paresthesia is a burning or prickling feeling. It often happens in the hands, arms, legs, or feet.  In most cases, the feeling goes away in a short time and is not a sign of a serious problem.  If you have paresthesia that lasts a long time, you may need to be seen by your doctor. This information is not intended to replace advice given to you by your health care provider. Make sure you discuss any questions you have with your health care provider. Document Released: 10/23/2008 Document Revised: 11/19/2017 Document Reviewed: 11/19/2017 Elsevier Interactive Patient Education  2019 Elsevier Inc.  

## 2019-01-10 NOTE — Progress Notes (Signed)
Victor Nixon 53 y.o.   Chief Complaint  Patient presents with  . Pain    in the left leg shooting down to the calf muscle for several month. Pain also in both elbows. Not sure of the onset of the pain. Pain comes and goes. Not taking any medication for the pain  . Form Completion    for insurance purpose    HISTORY OF PRESENT ILLNESS: This is a 53 y.o. male complaining of chronic pain to both legs but mostly left leg waxing and waning for the past year.  Denies injuries.  Also has occasional pain to both elbows.  Also noticed rash that comes and goes to elbow area.  Complaining of numbness to left knee area as well.  Denies abdominal symptoms or urinary symptoms.  No lifestyle changes.  Non-smoker.  Physically active.  No other significant symptoms.  Mother has a history of arthritis.  No other chronic autoimmune disease family history.  HPI   Prior to Admission medications   Medication Sig Start Date End Date Taking? Authorizing Provider  fish oil-omega-3 fatty acids 1000 MG capsule Take 1 g by mouth daily.   Yes [provider]  Multiple Vitamin (MULTIVITAMIN) tablet Take 1 tablet by mouth daily.   Yes [provider]    Allergies  Allergen Reactions  . Cephalexin Rash    Patient Active Problem List   Diagnosis Date Noted  . Special screening for malignant neoplasms, colon 04/28/2017  . Routine general medical examination at a health care facility 04/10/2017  . Family history of melanoma 02/18/2016  . Renal calculus, left 06/21/2012    Past Medical History:  Diagnosis Date  . Allergy   . Broken arm (left arm) childhood  . Chronic kidney disease    stones  . Heart murmur   . Hyperlipidemia     Past Surgical History:  Procedure Laterality Date  . HERNIA REPAIR     age 14 or 71  . NEPHROLITHOTOMY  06/21/2012   Procedure: NEPHROLITHOTOMY PERCUTANEOUS;  Surgeon: Claybon Jabs, MD;  Location: [facility];  Service: Urology;  Laterality: Left;  left  ureteral stent    Social History   Socioeconomic History  . Marital status: Married    Spouse name: Not on file  . Number of children: 1  . Years of education: Not on file  . Highest education level: Not on file  Occupational History  . Occupation: Geographical information systems officer  . Financial resource strain: Not on file  . Food insecurity:    Worry: Not on file    Inability: Not on file  . Transportation needs:    Medical: Not on file    Non-medical: Not on file  Tobacco Use  . Smoking status: Never Smoker  . Smokeless tobacco: Never Used  Substance and Sexual Activity  . Alcohol use: Yes    Alcohol/week: 1.0 - 2.0 standard drinks    Types: 1 - 2 Glasses of wine per week    Comment: occassional beer, liquor  . Drug use: No  . Sexual activity: Yes    Partners: Female    Birth control/protection: Abstinence  Lifestyle  . Physical activity:    Days per week: Not on file    Minutes per session: Not on file  . Stress: Not on file  Relationships  . Social connections:    Talks on phone: Not on file    Gets together: Not on file    Attends religious service:  Not on file    Active member of club or organization: Not on file    Attends meetings of clubs or organizations: Not on file    Relationship status: Not on file  . Intimate partner violence:    Fear of current or ex partner: Not on file    Emotionally abused: Not on file    Physically abused: Not on file    Forced sexual activity: Not on file  Other Topics Concern  . Not on file  Social History Narrative  . Not on file    Family History  Problem Relation Age of Onset  . Hip fracture Mother   . Cancer Father        melanoma  . Heart disease Father        heart valve replacement  . Colon cancer Neg Hx   . Esophageal cancer Neg Hx   . Rectal cancer Neg Hx   . Stomach cancer Neg Hx      Review of Systems  Constitutional: Negative.  Negative for chills and fever.  HENT: Negative.  Negative for congestion and sore  throat.   Eyes: Negative.  Negative for blurred vision, double vision, discharge and redness.  Respiratory: Negative.  Negative for shortness of breath.   Cardiovascular: Negative.  Negative for chest pain, palpitations, claudication and leg swelling.  Gastrointestinal: Negative for abdominal pain, diarrhea, nausea and vomiting.  Genitourinary: Negative for dysuria and hematuria.  Musculoskeletal: Positive for joint pain and myalgias.  Skin: Positive for rash.  Neurological: Positive for sensory change (Left lower leg). Negative for dizziness, focal weakness, seizures, loss of consciousness and headaches.  Endo/Heme/Allergies: Negative.   All other systems reviewed and are negative.   Vitals:   01/10/19 1057  BP: 124/79  Pulse: 65  Temp: 98.5 F (36.9 C)  SpO2: 100%    Physical Exam Vitals signs reviewed.  HENT:     Head: Normocephalic and atraumatic.     Mouth/Throat:     Mouth: Mucous membranes are moist.     Pharynx: Oropharynx is clear.  Eyes:     Extraocular Movements: Extraocular movements intact.     Conjunctiva/sclera: Conjunctivae normal.     Pupils: Pupils are equal, round, and reactive to light.  Neck:     Musculoskeletal: Normal range of motion and neck supple.  Cardiovascular:     Rate and Rhythm: Normal rate and regular rhythm.     Pulses: Normal pulses.     Heart sounds: Normal heart sounds.  Pulmonary:     Effort: Pulmonary effort is normal.     Breath sounds: Normal breath sounds.  Abdominal:     Palpations: Abdomen is soft.     Tenderness: There is no abdominal tenderness.  Musculoskeletal: Normal range of motion.        General: No tenderness.     Right lower leg: No edema.     Left lower leg: No edema.     Comments: Lower extremities: Touch.  No erythema or swelling.  NVI.  No tenderness to palpation.  Full range of motion at all joints.  Within normal limits.  No significant findings.  Lymphadenopathy:     Cervical: No cervical adenopathy.    Skin:    General: Skin is warm and dry.     Capillary Refill: Capillary refill takes less than 2 seconds.  Neurological:     General: No focal deficit present.     Mental Status: He is alert and oriented to person,  place, and time.     Sensory: No sensory deficit.     Motor: No weakness.     Coordination: Coordination normal.     Gait: Gait normal.     Deep Tendon Reflexes: Reflexes normal.  Psychiatric:        Mood and Affect: Mood normal.        Behavior: Behavior normal.    A total of 25 minutes was spent in the room with the patient, greater than 50% of which was in counseling/coordination of care regarding differential diagnosis, need for diagnostic work-up, management, neurology evaluation and possible rheumatology evaluation as well depending on blood results, and follow-up here with me.   ASSESSMENT & PLAN: Victor Nixon was seen today for pain and form completion.  Diagnoses and all orders for this visit:  Paresthesia of left lower extremity -     Cancel: Lipid panel -     Cancel: CBC with Differential/Platelet -     Cancel: Hemoglobin A1c -     Cancel: Comprehensive metabolic panel -     Cancel: Sedimentation Rate -     Cancel: CK -     Cancel: ANA,IFA RA Diag Pnl w/rflx Tit/Patn -     Ambulatory referral to Neurology -     CBC with Differential/Platelet -     Sedimentation Rate -     Cancel: Vitamin B12 -     Vitamin B12  Left leg pain -     Cancel: Lipid panel -     Cancel: CBC with Differential/Platelet -     Cancel: Hemoglobin A1c -     Cancel: Comprehensive metabolic panel -     Cancel: Sedimentation Rate -     Cancel: CK -     Cancel: ANA,IFA RA Diag Pnl w/rflx Tit/Patn -     Hemoglobin A1c -     CK -     Cancel: Vitamin B12  Rash and nonspecific skin eruption -     Cancel: Lipid panel -     Cancel: CBC with Differential/Platelet -     Cancel: Hemoglobin A1c -     Cancel: Comprehensive metabolic panel -     Cancel: Sedimentation Rate -     Cancel:  CK -     Cancel: ANA,IFA RA Diag Pnl w/rflx Tit/Patn -     Lipid panel -     Comprehensive metabolic panel -     ANA,IFA RA Diag Pnl w/rflx Tit/Patn -     Cancel: Vitamin B12    Patient Instructions  Paresthesia Paresthesia is a burning or prickling feeling. This feeling can happen in any part of the body. It often happens in the hands, arms, legs, or feet. Usually, it is not painful. In most cases, the feeling goes away in a short time and is not a sign of a serious problem. If you have paresthesia that lasts a long time, you may need to be seen by your doctor. Follow these instructions at home: Alcohol use   Do not drink alcohol if: ? Your doctor tells you not to drink. ? You are pregnant, may be pregnant, or are planning to become pregnant.  If you drink alcohol, limit how much you have: ? 0-1 drink a day for women. ? 0-2 drinks a day for men.  Be aware of how much alcohol is in your drink. In the [place] one drink equals one typical bottle of beer (12 oz), one-half glass of wine (5 oz), or one  shot of hard liquor (1 oz). Nutrition  Eat a healthy diet. This includes: ? Eating foods that have a lot of fiber in them, such as fresh fruits and vegetables, whole grains, and beans. ? Limiting foods that have a lot of fat and processed sugars in them, such as fried or sweet foods. General instructions  Take over-the-counter and prescription medicines only as told by your doctor.  Do not use any products that have nicotine or tobacco in them, such as cigarettes and e-cigarettes. If you need help quitting, ask your doctor.  If you have diabetes, work with your doctor to make sure your blood sugar stays in a healthy range.  If your feet feel numb: ? Check for redness, warmth, and swelling every day. ? Wear padded socks and comfortable shoes. These help protect your feet.  Keep all follow-up visits as told by your doctor. This is important. Contact a doctor if:  You have  paresthesia that gets worse or does not go away.  Your burning or prickling feeling gets worse when you walk.  You have pain or cramps.  You feel dizzy.  You have a rash. Get help right away if you:  Feel weak.  Have trouble walking or moving.  Have problems speaking, understanding, or seeing.  Feel confused.  Cannot control when you pee (urinate) or poop (have a bowel movement).  Lose feeling (have numbness) after an injury.  Have new weakness in an arm or leg.  Pass out (faint). Summary  Paresthesia is a burning or prickling feeling. It often happens in the hands, arms, legs, or feet.  In most cases, the feeling goes away in a short time and is not a sign of a serious problem.  If you have paresthesia that lasts a long time, you may need to be seen by your doctor. This information is not intended to replace advice given to you by your health care provider. Make sure you discuss any questions you have with your health care provider. Document Released: 10/23/2008 Document Revised: 11/19/2017 Document Reviewed: 11/19/2017 Elsevier Interactive Patient Education  2019 Elsevier Inc.     Agustina Caroli, MD [facility]

## 2019-01-10 NOTE — Telephone Encounter (Signed)
Form for insurance is to be faxed to [phone] once lab results are completed. Form is located in your box.

## 2019-01-11 ENCOUNTER — Telehealth: Payer: Self-pay | Admitting: *Deleted

## 2019-01-11 LAB — COMPREHENSIVE METABOLIC PANEL
AG Ratio: 1.5 (calc) (ref 1.0–2.5)
ALT: 12 U/L (ref 9–46)
AST: 18 U/L (ref 10–35)
Albumin: 4.6 g/dL (ref 3.6–5.1)
Alkaline phosphatase (APISO): 59 U/L (ref 35–144)
BUN: 12 mg/dL (ref 7–25)
CALCIUM: 9.7 mg/dL (ref 8.6–10.3)
CO2: 29 mmol/L (ref 20–32)
Chloride: 103 mmol/L (ref 98–110)
Creat: 0.88 mg/dL (ref 0.70–1.33)
Globulin: 3.1 g/dL (calc) (ref 1.9–3.7)
Glucose, Bld: 91 mg/dL (ref 65–99)
Potassium: 4.4 mmol/L (ref 3.5–5.3)
Sodium: 138 mmol/L (ref 135–146)
Total Bilirubin: 0.7 mg/dL (ref 0.2–1.2)
Total Protein: 7.7 g/dL (ref 6.1–8.1)

## 2019-01-11 LAB — VITAMIN B12: Vitamin B-12: 488 pg/mL (ref 200–1100)

## 2019-01-11 LAB — CBC WITH DIFFERENTIAL/PLATELET
Absolute Monocytes: 495 cells/uL (ref 200–950)
Basophils Absolute: 69 cells/uL (ref 0–200)
Basophils Relative: 1.4 %
EOS PCT: 2.8 %
Eosinophils Absolute: 137 cells/uL (ref 15–500)
HCT: 43 % (ref 38.5–50.0)
Hemoglobin: 15 g/dL (ref 13.2–17.1)
Lymphs Abs: 1774 cells/uL (ref 850–3900)
MCH: 30.9 pg (ref 27.0–33.0)
MCHC: 34.9 g/dL (ref 32.0–36.0)
MCV: 88.7 fL (ref 80.0–100.0)
MPV: 10.4 fL (ref 7.5–12.5)
Monocytes Relative: 10.1 %
Neutro Abs: 2426 cells/uL (ref 1500–7800)
Neutrophils Relative %: 49.5 %
PLATELETS: 356 10*3/uL (ref 140–400)
RBC: 4.85 10*6/uL (ref 4.20–5.80)
RDW: 12.6 % (ref 11.0–15.0)
Total Lymphocyte: 36.2 %
WBC: 4.9 10*3/uL (ref 3.8–10.8)

## 2019-01-11 LAB — HEMOGLOBIN A1C
Hgb A1c MFr Bld: 5.3 % of total Hgb (ref <5.7)
Mean Plasma Glucose: 105 (calc)
eAG (mmol/L): 5.8 (calc)

## 2019-01-11 LAB — ANA,IFA RA DIAG PNL W/RFLX TIT/PATN
Anti Nuclear Antibody(ANA): NEGATIVE
Cyclic Citrullin Peptide Ab: <16 UNITS
Rheumatoid fact SerPl-aCnc: <14 IU/mL (ref <14)

## 2019-01-11 LAB — LIPID PANEL
CHOLESTEROL: 280 mg/dL — AB (ref <200)
HDL: 74 mg/dL (ref >=40)
LDL Cholesterol (Calc): 188 mg/dL (calc) — ABNORMAL HIGH
Non-HDL Cholesterol (Calc): 206 mg/dL (calc) — ABNORMAL HIGH (ref <130)
Total CHOL/HDL Ratio: 3.8 (calc) (ref <5.0)
Triglycerides: 79 mg/dL (ref <150)

## 2019-01-11 LAB — CK: Total CK: 106 U/L (ref 44–196)

## 2019-01-11 LAB — SEDIMENTATION RATE: Sed Rate: 2 mm/h (ref 0–15)

## 2019-01-11 NOTE — Telephone Encounter (Signed)
Pt aware of message below. States he will come by today or tomorrow to sign the forms.

## 2019-01-11 NOTE — Telephone Encounter (Signed)
Left message in voice mail at mobile number, the health screening form needs your signature. You can keep the original for you to fax or the office can do it, the forms will be at the front desk. A copy has been made for the chart and needs your signature, too.

## 2019-01-12 ENCOUNTER — Encounter: Payer: Self-pay | Admitting: Radiology

## 2019-03-03 ENCOUNTER — Telehealth: Payer: Self-pay | Admitting: *Deleted

## 2019-03-03 NOTE — Telephone Encounter (Signed)
LVM advising due to current COVID 19 pandemic, our office is severely reducing in person visits in order to minimize the risk to our patients and healthcare providers. We recommend to convert your appointment to a video visit. Requested call back to discuss.

## 2019-03-07 NOTE — Telephone Encounter (Signed)
LVM #2 requesting call back to discuss converting new pt appt to video visit.

## 2019-03-07 NOTE — Telephone Encounter (Signed)
LVM advising patient that I cancelled his new pt appt Wed. However we do have openings on Thurs. Requested he call back and reschedule as a video visit.

## 2019-03-09 ENCOUNTER — Ambulatory Visit: Payer: 59 | Admitting: Diagnostic Neuroimaging

## 2019-09-17 ENCOUNTER — Encounter (INDEPENDENT_AMBULATORY_CARE_PROVIDER_SITE_OTHER): Payer: Self-pay

## 2019-11-25 HISTORY — PX: COLONOSCOPY: SHX174

## 2020-03-26 ENCOUNTER — Encounter: Payer: Self-pay | Admitting: Internal Medicine

## 2020-04-18 ENCOUNTER — Other Ambulatory Visit: Payer: Self-pay

## 2020-04-18 ENCOUNTER — Ambulatory Visit (AMBULATORY_SURGERY_CENTER): Payer: Self-pay

## 2020-04-18 VITALS — Ht 70.0 in | Wt 189.0 lb

## 2020-04-18 DIAGNOSIS — Z1211 Encounter for screening for malignant neoplasm of colon: Secondary | ICD-10-CM

## 2020-04-18 MED ORDER — SUTAB 1479-225-188 MG PO TABS
12.0000 | ORAL_TABLET | ORAL | 0 refills | Status: DC
Start: 2020-04-18 — End: 2020-05-03

## 2020-04-18 NOTE — Progress Notes (Signed)
No allergies to soy or egg Pt is not on blood thinners or diet pills Denies issues with sedation/intubation Denies atrial flutter/fib Denies constipation   Emmi instructions given to pt  Pt is aware of Covid safety and care partner requirements.  

## 2020-04-26 ENCOUNTER — Encounter: Payer: Self-pay | Admitting: Internal Medicine

## 2020-05-03 ENCOUNTER — Encounter: Payer: Self-pay | Admitting: Internal Medicine

## 2020-05-03 ENCOUNTER — Ambulatory Visit (AMBULATORY_SURGERY_CENTER): Payer: 59 | Admitting: Internal Medicine

## 2020-05-03 ENCOUNTER — Other Ambulatory Visit: Payer: Self-pay

## 2020-05-03 VITALS — BP 128/74 | HR 58 | Temp 98.2°F | Resp 17 | Ht 70.0 in | Wt 185.0 lb

## 2020-05-03 DIAGNOSIS — Z1211 Encounter for screening for malignant neoplasm of colon: Secondary | ICD-10-CM | POA: Diagnosis present

## 2020-05-03 DIAGNOSIS — D120 Benign neoplasm of cecum: Secondary | ICD-10-CM | POA: Diagnosis not present

## 2020-05-03 DIAGNOSIS — K635 Polyp of colon: Secondary | ICD-10-CM

## 2020-05-03 DIAGNOSIS — D124 Benign neoplasm of descending colon: Secondary | ICD-10-CM | POA: Diagnosis not present

## 2020-05-03 DIAGNOSIS — D123 Benign neoplasm of transverse colon: Secondary | ICD-10-CM

## 2020-05-03 MED ORDER — SODIUM CHLORIDE 0.9 % IV SOLN: 500.0000 mL | Freq: Once | INTRAVENOUS | Status: DC

## 2020-05-03 NOTE — Progress Notes (Signed)
Called to room to assist during endoscopic procedure.  Patient ID and intended procedure confirmed with present staff. Received instructions for my participation in the procedure from the performing physician.  

## 2020-05-03 NOTE — Progress Notes (Signed)
Propofol given at beginning and throughout procedure to maintain safe and appropriate comfort level. VO/MDHebert Soho, CRNA

## 2020-05-03 NOTE — Progress Notes (Signed)
PT taken to PACU. Monitors in place. VSS. Report given to RN.PT taken to PACU. Monitors in place. VSS. Report given to RN.

## 2020-05-03 NOTE — Progress Notes (Signed)
Pt. Reports no change in his medical or surgical history since his pre-visit 04/18/2020.

## 2020-05-03 NOTE — Op Note (Signed)
[facility] Patient Name: Victor Nixon Procedure Date: 05/03/2020 9:31 AM MRN: [id number] Endoscopist: Jerene Bears , MD Age: 54 Referring MD:  Date of Birth: January 11, 1966 Gender: Male Account #: [id number] Procedure:                Colonoscopy Indications:              Screening for colorectal malignant neoplasm, This                            is the patient's first colonoscopy Medicines:                Monitored Anesthesia Care Procedure:                Pre-Anesthesia Assessment:                           - Prior to the procedure, a History and Physical                            was performed, and patient medications and                            allergies were reviewed. The patient's tolerance of                            previous anesthesia was also reviewed. The risks                            and benefits of the procedure and the sedation                            options and risks were discussed with the patient.                            All questions were answered, and informed consent                            was obtained. Prior Anticoagulants: The patient has                            taken no previous anticoagulant or antiplatelet                            agents. ASA Grade Assessment: II - A patient with                            mild systemic disease. After reviewing the risks                            and benefits, the patient was deemed in                            satisfactory condition to undergo the procedure.  After obtaining informed consent, the colonoscope                            was passed under direct vision. Throughout the                            procedure, the patient's blood pressure, pulse, and                            oxygen saturations were monitored continuously. The                            Colonoscope was introduced through the anus and                            advanced to the cecum,  identified by appendiceal                            orifice and ileocecal valve. The colonoscopy was                            performed without difficulty. The patient tolerated                            the procedure well. The quality of the bowel                            preparation was excellent. The ileocecal valve,                            appendiceal orifice, and rectum were photographed.                            The bowel preparation used was SuTab via split dose                            instruction. Scope In: 10:00:50 AM Scope Out: 10:22:27 AM Scope Withdrawal Time: 0 hours 16 minutes 36 seconds  Total Procedure Duration: 0 hours 21 minutes 37 seconds  Findings:                 The digital rectal exam was normal.                           Two sessile polyps were found in the cecum. The                            polyps were 3 to 5 mm in size. These polyps were                            removed with a cold snare. Resection and retrieval                            were complete.  A 7 mm polyp was found in the hepatic flexure. The                            polyp was sessile. The polyp was removed with a                            cold snare. Resection and retrieval were complete.                           A 15 mm polyp was found in the hepatic flexure. The                            polyp was sessile. The polyp was removed with a                            cold snare. Resection and retrieval were complete.                           A 3 mm polyp was found in the descending colon. The                            polyp was sessile. The polyp was removed with a                            cold snare. Resection and retrieval were complete.                           Internal hemorrhoids were found during                            retroflexion. The hemorrhoids were small. Complications:            No immediate complications. Estimated Blood Loss:      Estimated blood loss was minimal. Impression:               - Two 3 to 5 mm polyps in the cecum, removed with a                            cold snare. Resected and retrieved.                           - One 7 mm polyp at the hepatic flexure, removed                            with a cold snare. Resected and retrieved.                           - One 15 mm polyp at the hepatic flexure, removed                            with a cold snare. Resected and retrieved.                           -  One 3 mm polyp in the descending colon, removed                            with a cold snare. Resected and retrieved.                           - Small internal hemorrhoids. Recommendation:           - Patient has a contact number available for                            emergencies. The signs and symptoms of potential                            delayed complications were discussed with the                            patient. Return to normal activities tomorrow.                            Written discharge instructions were provided to the                            patient.                           - Resume previous diet.                           - Continue present medications.                           - Await pathology results.                           - Repeat colonoscopy is recommended for                            surveillance. The colonoscopy date will be                            determined after pathology results from today's                            exam become available for review. Jerene Bears, MD 05/03/2020 10:43:41 AM This report has been signed electronically.

## 2020-05-03 NOTE — Patient Instructions (Signed)
Read all of the handouts given to you by your recovery room nurse.  Thank-you for choosing us for your healthcare needs today.  YOU HAD AN ENDOSCOPIC PROCEDURE TODAY AT THE [facility]:   Refer to the procedure report that was given to you for any specific questions about what was found during the examination.  If the procedure report does not answer your questions, please call your gastroenterologist to clarify.  If you requested that your care partner not be given the details of your procedure findings, then the procedure report has been included in a sealed envelope for you to review at your convenience later.  YOU SHOULD EXPECT: Some feelings of bloating in the abdomen. Passage of more gas than usual.  Walking can help get rid of the air that was put into your GI tract during the procedure and reduce the bloating. If you had a lower endoscopy (such as a colonoscopy or flexible sigmoidoscopy) you may notice spotting of blood in your stool or on the toilet paper. If you underwent a bowel prep for your procedure, you may not have a normal bowel movement for a few days.  Please Note:  You might notice some irritation and congestion in your nose or some drainage.  This is from the oxygen used during your procedure.  There is no need for concern and it should clear up in a day or so.  SYMPTOMS TO REPORT IMMEDIATELY:   Following lower endoscopy (colonoscopy or flexible sigmoidoscopy):  Excessive amounts of blood in the stool  Significant tenderness or worsening of abdominal pains  Swelling of the abdomen that is new, acute  Fever of 100F or higher   For urgent or emergent issues, a gastroenterologist can be reached at any hour by calling [phone]. Do not use MyChart messaging for urgent concerns.    DIET:  We do recommend a small meal at first, but then you may proceed to your regular diet.  Drink plenty of fluids but you should avoid alcoholic beverages for 24 hours. Try to  increase the fiber in your diet, and drink plenty of water.  ACTIVITY:  You should plan to take it easy for the rest of today and you should NOT DRIVE or use heavy machinery until tomorrow (because of the sedation medicines used during the test).    FOLLOW UP: Our staff will call the number listed on your records 48-72 hours following your procedure to check on you and address any questions or concerns that you may have regarding the information given to you following your procedure. If we do not reach you, we will leave a message.  We will attempt to reach you two times.  During this call, we will ask if you have developed any symptoms of COVID 19. If you develop any symptoms (ie: fever, flu-like symptoms, shortness of breath, cough etc.) before then, please call [phone].  If you test positive for Covid 19 in the 2 weeks post procedure, please call and report this information to us.    If any biopsies were taken you will be contacted by phone or by letter within the next 1-3 weeks.  Please call us at [phone] if you have not heard about the biopsies in 3 weeks.    SIGNATURES/CONFIDENTIALITY: You and/or your care partner have signed paperwork which will be entered into your electronic medical record.  These signatures attest to the fact that that the information above on your After Visit Summary has been reviewed   reviewed and is understood.  Full responsibility of the confidentiality of this discharge information lies with you and/or your care-partner. 

## 2020-05-07 ENCOUNTER — Encounter: Payer: Self-pay | Admitting: Internal Medicine

## 2020-05-07 ENCOUNTER — Telehealth: Payer: Self-pay

## 2020-05-07 NOTE — Telephone Encounter (Signed)
  Follow up Call-  Call back number 05/03/2020  Post procedure Call Back phone  # [phone]  Permission to leave phone message Yes  Some recent data might be hidden     Patient questions:  Do you have a fever, pain , or abdominal swelling? No. Pain Score  0 *  Have you tolerated food without any problems? Yes.    Have you been able to return to your normal activities? Yes.    Do you have any questions about your discharge instructions: Diet   No. Medications  No. Follow up visit  No.  Do you have questions or concerns about your Care? No.  Actions: * If pain score is 4 or above: No action needed, pain <4.  1. Have you developed a fever since your procedure? no  2.   Have you had an respiratory symptoms (SOB or cough) since your procedure? no  3.   Have you tested positive for COVID 19 since your procedure no  4.   Have you had any family members/close contacts diagnosed with the COVID 19 since your procedure?  no   If yes to any of these questions please route to Joylene John, RN and Erenest Rasher, RN

## 2021-12-10 ENCOUNTER — Encounter: Payer: Self-pay | Admitting: Emergency Medicine

## 2021-12-10 ENCOUNTER — Other Ambulatory Visit: Payer: Self-pay

## 2021-12-10 ENCOUNTER — Ambulatory Visit (INDEPENDENT_AMBULATORY_CARE_PROVIDER_SITE_OTHER): Payer: 59 | Admitting: Emergency Medicine

## 2021-12-10 VITALS — BP 118/70 | HR 64 | Ht 70.0 in | Wt 193.0 lb

## 2021-12-10 DIAGNOSIS — Z0000 Encounter for general adult medical examination without abnormal findings: Secondary | ICD-10-CM

## 2021-12-10 DIAGNOSIS — Z13220 Encounter for screening for lipoid disorders: Secondary | ICD-10-CM | POA: Diagnosis not present

## 2021-12-10 DIAGNOSIS — Z13228 Encounter for screening for other metabolic disorders: Secondary | ICD-10-CM

## 2021-12-10 DIAGNOSIS — Z1329 Encounter for screening for other suspected endocrine disorder: Secondary | ICD-10-CM

## 2021-12-10 DIAGNOSIS — Z1159 Encounter for screening for other viral diseases: Secondary | ICD-10-CM | POA: Diagnosis not present

## 2021-12-10 DIAGNOSIS — Z130 Encounter for screening for diseases of the blood and blood-forming organs and certain disorders involving the immune mechanism: Secondary | ICD-10-CM

## 2021-12-10 DIAGNOSIS — Z23 Encounter for immunization: Secondary | ICD-10-CM

## 2021-12-10 LAB — COMPREHENSIVE METABOLIC PANEL
ALT: 13 U/L (ref 0–53)
AST: 18 U/L (ref 0–37)
Albumin: 4.5 g/dL (ref 3.5–5.2)
Alkaline Phosphatase: 56 U/L (ref 39–117)
BUN: 11 mg/dL (ref 6–23)
CO2: 28 mEq/L (ref 19–32)
Calcium: 9.1 mg/dL (ref 8.4–10.5)
Chloride: 101 mEq/L (ref 96–112)
Creatinine, Ser: 0.84 mg/dL (ref 0.40–1.50)
GFR: 99.78 mL/min (ref >60.00)
Glucose, Bld: 81 mg/dL (ref 70–99)
Potassium: 3.8 mEq/L (ref 3.5–5.1)
Sodium: 137 mEq/L (ref 135–145)
Total Bilirubin: 0.6 mg/dL (ref 0.2–1.2)
Total Protein: 7.7 g/dL (ref 6.0–8.3)

## 2021-12-10 LAB — CBC WITH DIFFERENTIAL/PLATELET
Basophils Absolute: 0.1 10*3/uL (ref 0.0–0.1)
Basophils Relative: 1 % (ref 0.0–3.0)
Eosinophils Absolute: 0.2 10*3/uL (ref 0.0–0.7)
Eosinophils Relative: 2.4 % (ref 0.0–5.0)
HCT: 42.6 % (ref 39.0–52.0)
Hemoglobin: 14.3 g/dL (ref 13.0–17.0)
Lymphocytes Relative: 35.3 % (ref 12.0–46.0)
Lymphs Abs: 2.3 10*3/uL (ref 0.7–4.0)
MCHC: 33.4 g/dL (ref 30.0–36.0)
MCV: 89.9 fl (ref 78.0–100.0)
Monocytes Absolute: 0.6 10*3/uL (ref 0.1–1.0)
Monocytes Relative: 9.2 % (ref 3.0–12.0)
Neutro Abs: 3.4 10*3/uL (ref 1.4–7.7)
Neutrophils Relative %: 52.1 % (ref 43.0–77.0)
Platelets: 382 10*3/uL (ref 150.0–400.0)
RBC: 4.74 Mil/uL (ref 4.22–5.81)
RDW: 13 % (ref 11.5–15.5)
WBC: 6.4 10*3/uL (ref 4.0–10.5)

## 2021-12-10 LAB — LIPID PANEL
Cholesterol: 302 mg/dL — ABNORMAL HIGH (ref 0–200)
HDL: 69.9 mg/dL (ref >39.00)
LDL Cholesterol: 215 mg/dL — ABNORMAL HIGH (ref 0–99)
NonHDL: 231.91
Total CHOL/HDL Ratio: 4
Triglycerides: 85 mg/dL (ref 0.0–149.0)
VLDL: 17 mg/dL (ref 0.0–40.0)

## 2021-12-10 LAB — HEMOGLOBIN A1C: Hgb A1c MFr Bld: 5.5 % (ref 4.6–6.5)

## 2021-12-10 NOTE — Patient Instructions (Signed)

## 2021-12-10 NOTE — Progress Notes (Signed)
Victor Nixon 56 y.o.   Chief Complaint  Patient presents with   Annual Exam    Form to be filled out for work, discuss headaches and cut on finger    HISTORY OF PRESENT ILLNESS: This is a 56 y.o. male here for annual exam. Healthy male with a healthy lifestyle. No chronic medical problems.  No chronic medications. Sustained laceration to left pinky finger 4 weeks ago with a sharp knife.  Healing well. Gets occasional headaches. No other complaints or medical concerns today.  HPI   Prior to Admission medications   Medication Sig Start Date End Date Taking? Authorizing Provider  fish oil-omega-3 fatty acids 1000 MG capsule Take 1 g by mouth daily.   Yes [provider]  Multiple Vitamin (MULTIVITAMIN) tablet Take 1 tablet by mouth daily. Patient not taking: Reported on 05/03/2020    [provider]  tamsulosin (FLOMAX) 0.4 MG CAPS capsule Take 0.4 mg by mouth daily. Patient not taking: Reported on 05/03/2020 03/09/20   [provider]    Allergies  Allergen Reactions   Cephalexin Rash    Patient Active Problem List   Diagnosis Date Noted   Family history of melanoma 02/18/2016    Past Medical History:  Diagnosis Date   Allergy    seasonal allergies   Broken arm (left arm) childhood   Chronic kidney disease    stones   Heart murmur    Stable - not audible 5 yrs ago   Hyperlipidemia     Past Surgical History:  Procedure Laterality Date   HERNIA REPAIR     age 89 or 8   NEPHROLITHOTOMY  06/21/2012   Procedure: NEPHROLITHOTOMY PERCUTANEOUS;  Surgeon: Claybon Jabs, MD;  Location: [facility];  Service: Urology;  Laterality: Left;  left ureteral stent    Social History   Socioeconomic History   Marital status: Married    Spouse name: Not on file   Number of children: 1   Years of education: Not on file   Highest education level: Not on file  Occupational History   Occupation: sales  Tobacco Use   Smoking status: Never   Smokeless  tobacco: Never  Vaping Use   Vaping Use: Never used  Substance and Sexual Activity   Alcohol use: Yes    Alcohol/week: 1.0 - 2.0 standard drink    Types: 1 - 2 Glasses of wine per week    Comment: occassional beer, liquor   Drug use: No   Sexual activity: Yes    Partners: Female    Birth control/protection: Abstinence  Other Topics Concern   Not on file  Social History Narrative   Not on file   Social Determinants of Health   Financial Resource Strain: Not on file  Food Insecurity: Not on file  Transportation Needs: Not on file  Physical Activity: Not on file  Stress: Not on file  Social Connections: Not on file  Intimate Partner Violence: Not on file    Family History  Problem Relation Age of Onset   Hip fracture Mother    Cancer Father        melanoma   Heart disease Father        heart valve replacement   Colon cancer Neg Hx    Esophageal cancer Neg Hx    Rectal cancer Neg Hx    Stomach cancer Neg Hx    Colon polyps Neg Hx      Review of Systems  Constitutional: Negative.  Negative for chills and fever.  HENT: Negative.  Negative for congestion and sore throat.   Eyes: Negative.   Respiratory: Negative.  Negative for cough and shortness of breath.   Cardiovascular: Negative.  Negative for chest pain and palpitations.  Gastrointestinal: Negative.  Negative for abdominal pain, diarrhea, nausea and vomiting.  Genitourinary: Negative.   Skin: Negative.  Negative for rash.  Neurological:  Positive for headaches. Negative for dizziness.  All other systems reviewed and are negative. Today's Vitals   12/10/21 1534  BP: 118/70  Pulse: 64  SpO2: 98%  Weight: 193 lb (87.5 kg)  Height: 5\' 10"  (1.778 m)   Body mass index is 27.69 kg/m.   Physical Exam Vitals reviewed.  Constitutional:      Appearance: Normal appearance.  HENT:     Head: Normocephalic.     Right Ear: Tympanic membrane, ear canal and external ear normal.     Left Ear: Tympanic membrane,  ear canal and external ear normal.     Mouth/Throat:     Mouth: Mucous membranes are moist.     Pharynx: Oropharynx is clear.  Eyes:     Extraocular Movements: Extraocular movements intact.     Pupils: Pupils are equal, round, and reactive to light.  Cardiovascular:     Rate and Rhythm: Normal rate and regular rhythm.     Pulses: Normal pulses.     Heart sounds: Normal heart sounds.  Pulmonary:     Effort: Pulmonary effort is normal.     Breath sounds: Normal breath sounds.  Abdominal:     General: Bowel sounds are normal. There is no distension.     Palpations: Abdomen is soft.     Tenderness: There is no abdominal tenderness.  Musculoskeletal:        General: Normal range of motion.     Cervical back: Normal range of motion and neck supple.     Right lower leg: No edema.     Left lower leg: No edema.  Skin:    General: Skin is warm and dry.     Capillary Refill: Capillary refill takes less than 2 seconds.     Comments: Old laceration to distal left fifth finger.  Healing well.  No signs of infection or foreign body.  Neurological:     General: No focal deficit present.     Mental Status: He is alert and oriented to person, place, and time.  Psychiatric:        Mood and Affect: Mood normal.        Behavior: Behavior normal.     ASSESSMENT & PLAN: Problem List Items Addressed This Visit   None Visit Diagnoses     Routine general medical examination at a health care facility    -  Primary   Need for shingles vaccine       Relevant Orders   Varicella-zoster vaccine IM (Shingrix)   Need for hepatitis C screening test       Relevant Orders   Hepatitis C antibody screen   Screening for deficiency anemia       Relevant Orders   CBC with Differential   Screening for lipoid disorders       Relevant Orders   Lipid panel   Screening for endocrine, metabolic and immunity disorder       Relevant Orders   Comprehensive metabolic panel   Hemoglobin A1c      Modifiable  risk factors discussed with patient. Anticipatory guidance according to  age provided. The following topics were also discussed: Social Determinants of Health Smoking.  Non-smoker Diet and nutrition Benefits of exercise Cancer screening and review of most recent colonoscopy report Vaccinations Cardiovascular risk assessment Mental health including depression and anxiety Fall and accident prevention  Patient Instructions  Health Maintenance, Male Adopting a healthy lifestyle and getting preventive care are important in promoting health and wellness. Ask your health care provider about: The right schedule for you to have regular tests and exams. Things you can do on your own to prevent diseases and keep yourself healthy. What should I know about diet, weight, and exercise? Eat a healthy diet  Eat a diet that includes plenty of vegetables, fruits, low-fat dairy products, and lean protein. Do not eat a lot of foods that are high in solid fats, added sugars, or sodium. Maintain a healthy weight Body mass index (BMI) is a measurement that can be used to identify possible weight problems. It estimates body fat based on height and weight. Your health care provider can help determine your BMI and help you achieve or maintain a healthy weight. Get regular exercise Get regular exercise. This is one of the most important things you can do for your health. Most adults should: Exercise for at least 150 minutes each week. The exercise should increase your heart rate and make you sweat (moderate-intensity exercise). Do strengthening exercises at least twice a week. This is in addition to the moderate-intensity exercise. Spend less time sitting. Even light physical activity can be beneficial. Watch cholesterol and blood lipids Have your blood tested for lipids and cholesterol at 54 years of age, then have this test every 5 years. You may need to have your cholesterol levels checked more often if: Your  lipid or cholesterol levels are high. You are older than 54 years of age. You are at high risk for heart disease. What should I know about cancer screening? Many types of cancers can be detected early and may often be prevented. Depending on your health history and family history, you may need to have cancer screening at various ages. This may include screening for: Colorectal cancer. Prostate cancer. Skin cancer. Lung cancer. What should I know about heart disease, diabetes, and high blood pressure? Blood pressure and heart disease High blood pressure causes heart disease and increases the risk of stroke. This is more likely to develop in people who have high blood pressure readings or are overweight. Talk with your health care provider about your target blood pressure readings. Have your blood pressure checked: Every 3-5 years if you are 26-79 years of age. Every year if you are 47 years old or older. If you are between the ages of 68 and 34 and are a current or former smoker, ask your health care provider if you should have a one-time screening for abdominal aortic aneurysm (AAA). Diabetes Have regular diabetes screenings. This checks your fasting blood sugar level. Have the screening done: Once every three years after age 71 if you are at a normal weight and have a low risk for diabetes. More often and at a younger age if you are overweight or have a high risk for diabetes. What should I know about preventing infection? Hepatitis B If you have a higher risk for hepatitis B, you should be screened for this virus. Talk with your health care provider to find out if you are at risk for hepatitis B infection. Hepatitis C Blood testing is recommended for: Everyone born from [birth year] through  [place] with known risk factors for hepatitis C. Sexually transmitted infections (STIs) You should be screened each year for STIs, including gonorrhea and chlamydia, if: You are sexually active and  are younger than 54 years of age. You are older than 54 years of age and your health care provider tells you that you are at risk for this type of infection. Your sexual activity has changed since you were last screened, and you are at increased risk for chlamydia or gonorrhea. Ask your health care provider if you are at risk. Ask your health care provider about whether you are at high risk for HIV. Your health care provider may recommend a prescription medicine to help prevent HIV infection. If you choose to take medicine to prevent HIV, you should first get tested for HIV. You should then be tested every 3 months for as long as you are taking the medicine. Follow these instructions at home: Alcohol use Do not drink alcohol if your health care provider tells you not to drink. If you drink alcohol: Limit how much you have to 0-2 drinks a day. Know how much alcohol is in your drink. In the [place] one drink equals one 12 oz bottle of beer (355 mL), one 5 oz glass of wine (148 mL), or one 1 oz glass of hard liquor (44 mL). Lifestyle Do not use any products that contain nicotine or tobacco. These products include cigarettes, chewing tobacco, and vaping devices, such as e-cigarettes. If you need help quitting, ask your health care provider. Do not use street drugs. Do not share needles. Ask your health care provider for help if you need support or information about quitting drugs. General instructions Schedule regular health, dental, and eye exams. Stay current with your vaccines. Tell your health care provider if: You often feel depressed. You have ever been abused or do not feel safe at home. Summary Adopting a healthy lifestyle and getting preventive care are important in promoting health and wellness. Follow your health care provider's instructions about healthy diet, exercising, and getting tested or screened for diseases. Follow your health care provider's instructions on monitoring your  cholesterol and blood pressure. This information is not intended to replace advice given to you by your health care provider. Make sure you discuss any questions you have with your health care provider. Document Revised: 04/01/2021 Document Reviewed: 04/01/2021 Elsevier Patient Education  2022 [place], [place] [facility]

## 2021-12-11 ENCOUNTER — Other Ambulatory Visit: Payer: Self-pay | Admitting: Emergency Medicine

## 2021-12-11 DIAGNOSIS — E785 Hyperlipidemia, unspecified: Secondary | ICD-10-CM

## 2021-12-11 LAB — HEPATITIS C ANTIBODY
Hepatitis C Ab: NONREACTIVE
SIGNAL TO CUT-OFF: 0.08 (ref <1.00)

## 2021-12-11 MED ORDER — ROSUVASTATIN CALCIUM 10 MG PO TABS: 10.0000 mg | ORAL_TABLET | Freq: Every day | ORAL | 3 refills | Status: DC

## 2021-12-11 NOTE — Progress Notes (Signed)
Lab Results  Component Value Date   CHOL 302 (H) 12/10/2021   HDL 69.90 12/10/2021   LDLCALC 215 (H) 12/10/2021   TRIG 85.0 12/10/2021   CHOLHDL 4 12/10/2021   The 10-year ASCVD risk score (Arnett DK, et al., 2019) is: 4.9%   Values used to calculate the score:     Age: 56 years     Sex: Male     Is Non-Hispanic African American: No     Diabetic: No     Tobacco smoker: No     Systolic Blood Pressure: 251 mmHg     Is BP treated: No     HDL Cholesterol: 69.9 mg/dL     Total Cholesterol: 302 mg/dL Recommend to start rosuvastatin 10 mg daily.

## 2021-12-13 ENCOUNTER — Telehealth: Payer: Self-pay | Admitting: Emergency Medicine

## 2021-12-13 NOTE — Telephone Encounter (Signed)
Patient calling in  Patient wanted to check status of forms that were being completed  Advised patient nurse would check status & fu w/ him  Please call [phone]

## 2021-12-14 NOTE — Telephone Encounter (Signed)
Please look into this.  Any updates?  Thanks.

## 2021-12-16 NOTE — Telephone Encounter (Signed)
Re-faxed pt physical form and updated patient.

## 2021-12-16 NOTE — Telephone Encounter (Signed)
Thanks

## 2022-02-12 ENCOUNTER — Encounter: Payer: Self-pay | Admitting: Emergency Medicine

## 2022-02-12 DIAGNOSIS — E785 Hyperlipidemia, unspecified: Secondary | ICD-10-CM

## 2022-02-13 MED ORDER — ROSUVASTATIN CALCIUM 10 MG PO TABS: 10.0000 mg | ORAL_TABLET | Freq: Every day | ORAL | 0 refills | Status: DC

## 2022-03-10 ENCOUNTER — Ambulatory Visit (INDEPENDENT_AMBULATORY_CARE_PROVIDER_SITE_OTHER): Payer: 59

## 2022-03-10 DIAGNOSIS — Z23 Encounter for immunization: Secondary | ICD-10-CM | POA: Diagnosis not present

## 2022-03-10 NOTE — Progress Notes (Signed)
Pt came in for 2nd shingles vaccination. Vaccine was given w/o complications. ?

## 2022-04-21 ENCOUNTER — Other Ambulatory Visit: Payer: Self-pay | Admitting: Emergency Medicine

## 2022-04-21 DIAGNOSIS — E785 Hyperlipidemia, unspecified: Secondary | ICD-10-CM

## 2022-04-24 ENCOUNTER — Ambulatory Visit (INDEPENDENT_AMBULATORY_CARE_PROVIDER_SITE_OTHER): Payer: 59 | Admitting: Internal Medicine

## 2022-04-24 ENCOUNTER — Encounter: Payer: Self-pay | Admitting: Internal Medicine

## 2022-04-24 VITALS — BP 122/68 | HR 78 | Temp 98.1°F | Ht 70.0 in | Wt 180.0 lb

## 2022-04-24 DIAGNOSIS — E7800 Pure hypercholesterolemia, unspecified: Secondary | ICD-10-CM

## 2022-04-24 DIAGNOSIS — E785 Hyperlipidemia, unspecified: Secondary | ICD-10-CM | POA: Insufficient documentation

## 2022-04-24 DIAGNOSIS — R1032 Left lower quadrant pain: Secondary | ICD-10-CM

## 2022-04-24 DIAGNOSIS — Z125 Encounter for screening for malignant neoplasm of prostate: Secondary | ICD-10-CM | POA: Diagnosis not present

## 2022-04-24 DIAGNOSIS — K4090 Unilateral inguinal hernia, without obstruction or gangrene, not specified as recurrent: Secondary | ICD-10-CM

## 2022-04-24 LAB — HEPATIC FUNCTION PANEL
ALT: 15 U/L (ref 0–53)
AST: 17 U/L (ref 0–37)
Albumin: 4.5 g/dL (ref 3.5–5.2)
Alkaline Phosphatase: 63 U/L (ref 39–117)
Bilirubin, Direct: 0.1 mg/dL (ref 0.0–0.3)
Total Bilirubin: 0.6 mg/dL (ref 0.2–1.2)
Total Protein: 7.3 g/dL (ref 6.0–8.3)

## 2022-04-24 LAB — CBC WITH DIFFERENTIAL/PLATELET
Basophils Absolute: 0.1 10*3/uL (ref 0.0–0.1)
Basophils Relative: 0.9 % (ref 0.0–3.0)
Eosinophils Absolute: 0.1 10*3/uL (ref 0.0–0.7)
Eosinophils Relative: 1.1 % (ref 0.0–5.0)
HCT: 42.2 % (ref 39.0–52.0)
Hemoglobin: 14.1 g/dL (ref 13.0–17.0)
Lymphocytes Relative: 23.3 % (ref 12.0–46.0)
Lymphs Abs: 1.4 10*3/uL (ref 0.7–4.0)
MCHC: 33.4 g/dL (ref 30.0–36.0)
MCV: 90 fl (ref 78.0–100.0)
Monocytes Absolute: 0.5 10*3/uL (ref 0.1–1.0)
Monocytes Relative: 8.3 % (ref 3.0–12.0)
Neutro Abs: 4 10*3/uL (ref 1.4–7.7)
Neutrophils Relative %: 66.4 % (ref 43.0–77.0)
Platelets: 352 10*3/uL (ref 150.0–400.0)
RBC: 4.69 Mil/uL (ref 4.22–5.81)
RDW: 13.4 % (ref 11.5–15.5)
WBC: 6 10*3/uL (ref 4.0–10.5)

## 2022-04-24 LAB — PSA: PSA: 0.21 ng/mL (ref 0.10–4.00)

## 2022-04-24 LAB — BASIC METABOLIC PANEL
BUN: 16 mg/dL (ref 6–23)
CO2: 27 mEq/L (ref 19–32)
Calcium: 9.6 mg/dL (ref 8.4–10.5)
Chloride: 101 mEq/L (ref 96–112)
Creatinine, Ser: 0.86 mg/dL (ref 0.40–1.50)
GFR: 98.81 mL/min (ref >60.00)
Glucose, Bld: 87 mg/dL (ref 70–99)
Potassium: 4.1 mEq/L (ref 3.5–5.1)
Sodium: 137 mEq/L (ref 135–145)

## 2022-04-24 LAB — LIPASE: Lipase: 18 U/L (ref 11.0–59.0)

## 2022-04-24 NOTE — Patient Instructions (Addendum)
Please continue all other medications as before, and refills have been done if requested.  Please have the pharmacy call with any other refills you may need.  Please keep your appointments with your specialists as you may have planned  Please call if you want the referral to [facility] for the right inguinal hernia  -  You will be contacted regarding the referral for: CT scan- asap  Please go to the LAB at the blood drawing area for the tests to be done, including the PSA for the prostate  You will be contacted by phone if any changes need to be made immediately.  Otherwise, you will receive a letter about your results with an explanation, but please check with MyChart first.  Please remember to sign up for MyChart if you have not done so, as this will be important to you in the future with finding out test results, communicating by private email, and scheduling acute appointments online when needed.

## 2022-04-24 NOTE — Progress Notes (Signed)
Patient ID: Victor Nixon, male   DOB: [birth year], 56 y.o.   MRN: [id number]        Chief Complaint: follow up lower abd pain, asking for psa as well, and new RIH       HPI:  Victor Nixon is a 56 y.o. male here with c/o 4 wks onset left lower abd pain and maybe side or flank pain, achy, mild to mod, 4-6/10 for the most part, ? Crampy at times, with some bloating, worse to lie on the right side for some reason when the pain is on the left; did do some recent yardwork also as well with lifting, and now has new right groin swelling wit mild intermittent dull discomfort as well, worse with cough, better to lie down.  Has hx of renal stone.  Has tried to change his diet but no change in bloated feeling to LLQ.  Pt denies chest pain, increased sob or doe, wheezing, orthopnea, PND, increased LE swelling, palpitations, dizziness or syncope.   Pt denies polydipsia, polyuria, or new focal neuro s/s.    Pt denies fever, wt loss, night sweats, loss of appetite, or other constitutional symptoms         Wt Readings from Last 3 Encounters:  04/24/22 180 lb (81.6 kg)  12/10/21 193 lb (87.5 kg)  05/03/20 185 lb (83.9 kg)   BP Readings from Last 3 Encounters:  04/24/22 122/68  12/10/21 118/70  05/03/20 128/74         Past Medical History:  Diagnosis Date   Allergy    seasonal allergies   Broken arm (left arm) childhood   Chronic kidney disease    stones   Heart murmur    Stable - not audible 5 yrs ago   Hyperlipidemia    Past Surgical History:  Procedure Laterality Date   HERNIA REPAIR     age 6 or 8   NEPHROLITHOTOMY  06/21/2012   Procedure: NEPHROLITHOTOMY PERCUTANEOUS;  Surgeon: Claybon Jabs, MD;  Location: [facility];  Service: Urology;  Laterality: Left;  left ureteral stent    reports that he has never smoked. He has never used smokeless tobacco. He reports current alcohol use of about 1.0 - 2.0 standard drink per week. He reports that he does not use drugs. family history includes  Cancer in his father; Heart disease in his father; Hip fracture in his mother. Allergies  Allergen Reactions   Cephalexin Rash   Current Outpatient Medications on File Prior to Visit  Medication Sig Dispense Refill   fish oil-omega-3 fatty acids 1000 MG capsule Take 1 g by mouth daily.     Multiple Vitamin (MULTIVITAMIN) tablet Take 1 tablet by mouth daily.     rosuvastatin (CRESTOR) 10 MG tablet TAKE 1 TABLET BY MOUTH DAILY 90 tablet 3   tamsulosin (FLOMAX) 0.4 MG CAPS capsule Take 0.4 mg by mouth daily. (Patient not taking: Reported on 05/03/2020)     No current facility-administered medications on file prior to visit.        ROS:  All others reviewed and negative.  Objective        PE:  BP 122/68 (BP Location: Right Arm, Patient Position: Sitting, Cuff Size: Large)   Pulse 78   Temp 98.1 F (36.7 C) (Oral)   Ht '5\' 10"'$  (1.778 m)   Wt 180 lb (81.6 kg)   SpO2 97%   BMI 25.83 kg/m  Constitutional: Pt appears in NAD               HENT: Head: NCAT.                Right Ear: External ear normal.                 Left Ear: External ear normal.                Eyes: . Pupils are equal, round, and reactive to light. Conjunctivae and EOM are normal               Nose: without d/c or deformity               Neck: Neck supple. Gross normal ROM               Cardiovascular: Normal rate and regular rhythm.                 Pulmonary/Chest: Effort normal and breath sounds without rales or wheezing.                Abd:  Soft, NT, ND, + BS, no organomegaly - benign appearing               Right inguinal area with mild reducible swelling               Neurological: Pt is alert. At baseline orientation, motor grossly intact               Skin: Skin is warm. No rashes, no other new lesions, LE edema - none               Psychiatric: Pt behavior is normal without agitation   Micro: none  Cardiac tracings I have personally interpreted today:  none  Pertinent Radiological  findings (summarize): none   Lab Results  Component Value Date   WBC 6.0 04/24/2022   HGB 14.1 04/24/2022   HCT 42.2 04/24/2022   PLT 352.0 04/24/2022   GLUCOSE 87 04/24/2022   CHOL 302 (H) 12/10/2021   TRIG 85.0 12/10/2021   HDL 69.90 12/10/2021   LDLCALC 215 (H) 12/10/2021   ALT 15 04/24/2022   AST 17 04/24/2022   NA 137 04/24/2022   K 4.1 04/24/2022   CL 101 04/24/2022   CREATININE 0.86 04/24/2022   BUN 16 04/24/2022   CO2 27 04/24/2022   TSH 1.620 04/10/2017   PSA 0.21 04/24/2022   INR 1.01 06/21/2012   HGBA1C 5.5 12/10/2021   Assessment/Plan:  Victor Nixon is a 56 y.o. White or Caucasian [1] male with  has a past medical history of Allergy, Broken arm (left arm) (childhood), Chronic kidney disease, Heart murmur, and Hyperlipidemia.  Screening for prostate cancer Also for PSA with labs,asympt,  to f/u any worsening symptoms or concerns  Right inguinal hernia New onset, mild symptomatic, ok to follow for now though I did want to refer to general surgury, pt declines for now to look into the abd pain issue first  Left lower quadrant abdominal pain ? Etiology not clear, differential includes recurrent renal stone, vs low grade diverticulitis vs other - for CT renal asap  HLD (hyperlipidemia) Lab Results  Component Value Date   LDLCALC 215 (H) 12/10/2021   Very severe uncontrolled, now on crestor 10 mg, pt asks for lab f/u today, to cont lower chol diet as well  Followup: Return if symptoms worsen or fail to improve.  Jeneen Rinks  Jenny Reichmann, MD 04/27/2022 11:44 AM [facility]

## 2022-04-25 LAB — URINALYSIS, ROUTINE W REFLEX MICROSCOPIC
Bilirubin Urine: NEGATIVE
Ketones, ur: NEGATIVE
Leukocytes,Ua: NEGATIVE
Nitrite: NEGATIVE
Specific Gravity, Urine: 1.015 (ref 1.000–1.030)
Total Protein, Urine: NEGATIVE
Urine Glucose: NEGATIVE
Urobilinogen, UA: 0.2 (ref 0.0–1.0)
pH: 6 (ref 5.0–8.0)

## 2022-04-27 ENCOUNTER — Encounter: Payer: Self-pay | Admitting: Internal Medicine

## 2022-04-27 DIAGNOSIS — Z125 Encounter for screening for malignant neoplasm of prostate: Secondary | ICD-10-CM | POA: Insufficient documentation

## 2022-04-27 DIAGNOSIS — K4090 Unilateral inguinal hernia, without obstruction or gangrene, not specified as recurrent: Secondary | ICD-10-CM | POA: Insufficient documentation

## 2022-04-27 DIAGNOSIS — R1032 Left lower quadrant pain: Secondary | ICD-10-CM | POA: Insufficient documentation

## 2022-04-27 NOTE — Assessment & Plan Note (Signed)
Lab Results  Component Value Date   Gem 215 (H) 12/10/2021   Very severe uncontrolled, now on crestor 10 mg, pt asks for lab f/u today, to cont lower chol diet as well

## 2022-04-27 NOTE — Assessment & Plan Note (Signed)
?   Etiology not clear, differential includes recurrent renal stone, vs low grade diverticulitis vs other - for CT renal asap

## 2022-04-27 NOTE — Assessment & Plan Note (Signed)
New onset, mild symptomatic, ok to follow for now though I did want to refer to general surgury, pt declines for now to look into the abd pain issue first

## 2022-04-27 NOTE — Assessment & Plan Note (Signed)
Also for PSA with labs,asympt,  to f/u any worsening symptoms or concerns

## 2022-05-23 ENCOUNTER — Other Ambulatory Visit: Payer: Self-pay | Admitting: Surgery

## 2022-05-28 ENCOUNTER — Other Ambulatory Visit: Payer: Self-pay | Admitting: Surgery

## 2022-05-28 NOTE — Progress Notes (Signed)
Sent message, via epic in basket, requesting order in epic from surgeon     05/28/22 1012  Preop Orders  Has preop orders? No  Name of staff/physician contacted for orders(Indicate phone or IB message) Coralie Keens, MD.

## 2022-05-30 ENCOUNTER — Other Ambulatory Visit: Payer: Self-pay | Admitting: Surgery

## 2022-06-03 NOTE — Progress Notes (Signed)
Anesthesia Review:  PCP: Cardiologist : Chest x-ray : EKG : Echo : Stress test: Cardiac Cath :  Activity level:  Sleep Study/ CPAP : Fasting Blood Sugar :      / Checks Blood Sugar -- times a day:   Blood Thinner/ Instructions /Last Dose: ASA / Instructions/ Last Dose :  

## 2022-06-04 NOTE — Progress Notes (Signed)
DUE TO COVID-19 ONLY ONE VISITOR IS ALLOWED TO COME WITH YOU AND STAY IN THE WAITING ROOM ONLY DURING PRE OP AND PROCEDURE DAY OF SURGERY.  2 VISITOR  MAY VISIT WITH YOU AFTER SURGERY IN YOUR PRIVATE ROOM DURING VISITING HOURS ONLY! YOU MAY HAVE ONE PERSON SPEND THE NITE WITH YOU IN YOUR ROOM AFTER SURGERY.     Your procedure is scheduled on:         06/11/2022   Report to [facility] Main  Entrance   Report to admitting at         0630am         AM DO NOT BRING INSURANCE CARD, PICTURE ID OR WALLET DAY OF SURGERY.      Call this number if you have problems the morning of surgery [phone]    REMEMBER: NO  SOLID FOODS , CANDY, GUM OR MINTS AFTER [place] .       Marland Kitchen CLEAR LIQUIDS UNTIL    0530am             DAY OF SURGERY.      PLEASE FINISH ENSURE DRINK PER SURGEON ORDER  WHICH NEEDS TO BE COMPLETED AT      0530am     MORNING OF SURGERY.       CLEAR LIQUID DIET   Foods Allowed      WATER BLACK COFFEE ( SUGAR OK, NO MILK, CREAM OR CREAMER) REGULAR AND DECAF  TEA ( SUGAR OK NO MILK, CREAM, OR CREAMER) REGULAR AND DECAF  PLAIN JELLO ( NO RED)  FRUIT ICES ( NO RED, NO FRUIT PULP)  POPSICLES ( NO RED)  JUICE- APPLE, WHITE GRAPE AND WHITE CRANBERRY  SPORT DRINK LIKE GATORADE ( NO RED)  CLEAR BROTH ( VEGETABLE , CHICKEN OR BEEF)                                                                     BRUSH YOUR TEETH MORNING OF SURGERY AND RINSE YOUR MOUTH OUT, NO CHEWING GUM CANDY OR MINTS.     Take these medicines the morning of surgery with A SIP OF WATER:  none     DO NOT TAKE ANY DIABETIC MEDICATIONS DAY OF YOUR SURGERY                               You may not have any metal on your body including hair pins and              piercings  Do not wear jewelry, make-up, lotions, powders or perfumes, deodorant             Do not wear nail polish on your fingernails.              IF YOU ARE A MALE AND WANT TO SHAVE UNDER ARMS OR LEGS PRIOR TO SURGERY  YOU MUST DO SO AT LEAST 48 HOURS PRIOR TO SURGERY.              Men may shave face and neck.   Do not bring valuables to the hospital. [facility] IS NOT  RESPONSIBLE   FOR VALUABLES.  Contacts, dentures or bridgework may not be worn into surgery.  Leave suitcase in the car. After surgery it may be brought to your room.     Patients discharged the day of surgery will not be allowed to drive home. IF YOU ARE HAVING SURGERY AND GOING HOME THE SAME DAY, YOU MUST HAVE AN ADULT TO DRIVE YOU HOME AND BE WITH YOU FOR 24 HOURS. YOU MAY GO HOME BY TAXI OR UBER OR ORTHERWISE, BUT AN ADULT MUST ACCOMPANY YOU HOME AND STAY WITH YOU FOR 24 HOURS.                Please read over the following fact sheets you were given: _____________________________________________________________________  [facility] - Preparing for Surgery Before surgery, you can play an important role.  Because skin is not sterile, your skin needs to be as free of germs as possible.  You can reduce the number of germs on your skin by washing with CHG (chlorahexidine gluconate) soap before surgery.  CHG is an antiseptic cleaner which kills germs and bonds with the skin to continue killing germs even after washing. Please DO NOT use if you have an allergy to CHG or antibacterial soaps.  If your skin becomes reddened/irritated stop using the CHG and inform your nurse when you arrive at Short Stay. Do not shave (including legs and underarms) for at least 48 hours prior to the first CHG shower.  You may shave your face/neck. Please follow these instructions carefully:  1.  Shower with CHG Soap the night before surgery and the  morning of Surgery.  2.  If you choose to wash your hair, wash your hair first as usual with your  normal  shampoo.  3.  After you shampoo, rinse your hair and body thoroughly to remove the  shampoo.                           4.  Use CHG as you would any other liquid soap.  You can apply chg directly  to the  skin and wash                       Gently with a scrungie or clean washcloth.  5.  Apply the CHG Soap to your body ONLY FROM THE NECK DOWN.   Do not use on face/ open                           Wound or open sores. Avoid contact with eyes, ears mouth and genitals (private parts).                       Wash face,  Genitals (private parts) with your normal soap.             6.  Wash thoroughly, paying special attention to the area where your surgery  will be performed.  7.  Thoroughly rinse your body with warm water from the neck down.  8.  DO NOT shower/wash with your normal soap after using and rinsing off  the CHG Soap.                9.  Pat yourself dry with a clean towel.            10.  Wear clean pajamas.  11.  Place clean sheets on your bed the night of your first shower and do not  sleep with pets. Day of Surgery : Do not apply any lotions/deodorants the morning of surgery.  Please wear clean clothes to the hospital/surgery center.  FAILURE TO FOLLOW THESE INSTRUCTIONS MAY RESULT IN THE CANCELLATION OF YOUR SURGERY PATIENT SIGNATURE_________________________________  NURSE SIGNATURE__________________________________  ________________________________________________________________________

## 2022-06-06 ENCOUNTER — Encounter (HOSPITAL_COMMUNITY)
Admission: RE | Admit: 2022-06-06 | Discharge: 2022-06-06 | Disposition: A | Discharge status: Home / Self Care | Payer: 59 | Source: Ambulatory Visit | Attending: Surgery | Admitting: Surgery

## 2022-06-06 ENCOUNTER — Encounter (HOSPITAL_COMMUNITY): Payer: Self-pay

## 2022-06-06 ENCOUNTER — Other Ambulatory Visit: Payer: Self-pay

## 2022-06-06 VITALS — BP 128/83 | HR 69 | Temp 98.3°F | Resp 16 | Ht 70.0 in | Wt 181.0 lb

## 2022-06-06 DIAGNOSIS — Z01812 Encounter for preprocedural laboratory examination: Secondary | ICD-10-CM | POA: Diagnosis present

## 2022-06-06 DIAGNOSIS — Z01818 Encounter for other preprocedural examination: Secondary | ICD-10-CM

## 2022-06-06 HISTORY — DX: Personal history of urinary calculi: Z87.442

## 2022-06-06 LAB — CBC
HCT: 46.1 % (ref 39.0–52.0)
Hemoglobin: 15.3 g/dL (ref 13.0–17.0)
MCH: 30.4 pg (ref 26.0–34.0)
MCHC: 33.2 g/dL (ref 30.0–36.0)
MCV: 91.5 fL (ref 80.0–100.0)
Platelets: 365 10*3/uL (ref 150–400)
RBC: 5.04 MIL/uL (ref 4.22–5.81)
RDW: 13.3 % (ref 11.5–15.5)
WBC: 6.4 10*3/uL (ref 4.0–10.5)
nRBC: 0 % (ref 0.0–0.2)

## 2022-06-10 NOTE — H&P (Signed)
REFERRING PHYSICIAN: Biagio Borg, MD   MRN: [id number] DOB: 1966-05-14   Subjective   Chief Complaint: New Consultation (Right Ing. Hernia )   History of Present Illness: Victor Nixon is a 56 y.o. male who is seen as an office consultation at the request of Dr. Jenny Reichmann for evaluation of New Consultation (Right Ing. Hernia ) .   Patient is a 56 year old male who comes in secondary to a right-sided inguinal hernia. Patient states the hernias been there for approximately a month. He states that he does give him some discomfort pain. He states that he has had no signs or symptoms of incarceration or strangulation. He had a previous hernia repair as a child on the left side.  Patient's had no signs or symptoms of incarceration/strangulation.  Patient states he may be changing jobs as well as insurances in the near future.  Review of Systems: A complete review of systems was obtained from the patient. I have reviewed this information and discussed as appropriate with the patient. See HPI as well for other ROS.  Review of Systems  Constitutional: Negative for fever.  HENT: Negative for congestion.  Eyes: Negative for blurred vision.  Respiratory: Negative for cough, shortness of breath and wheezing.  Cardiovascular: Negative for chest pain and palpitations.  Gastrointestinal: Negative for heartburn.  Genitourinary: Negative for dysuria.  Musculoskeletal: Negative for myalgias.  Skin: Negative for rash.  Neurological: Negative for dizziness and headaches.  Psychiatric/Behavioral: Negative for depression and suicidal ideas.  All other systems reviewed and are negative.   Medical History: Past Medical History:  Diagnosis Date  Hyperlipidemia   Patient Active Problem List  Diagnosis  Family history of melanoma  HLD (hyperlipidemia)  Left lower quadrant abdominal pain  Right inguinal hernia  Screening for prostate cancer   Past Surgical History:  Procedure Laterality Date   HERNIA REPAIR 1977  Kidney Stones 2015    Allergies  Allergen Reactions  Cephalexin Rash   Current Outpatient Medications on File Prior to Visit  Medication Sig Dispense Refill  rosuvastatin (CRESTOR) 10 MG tablet Take 1 tablet by mouth once daily  docosahexaenoic acid-epa 120-180 mg Cap Take by mouth   No current facility-administered medications on file prior to visit.   History reviewed. No pertinent family history.   Social History   Tobacco Use  Smoking Status Never  Smokeless Tobacco Never    Social History   Socioeconomic History  Marital status: Married  Tobacco Use  Smoking status: Never  Smokeless tobacco: Never  Substance and Sexual Activity  Alcohol use: Yes  Drug use: Never   Objective:   Vitals:  BP: (!) 140/60  Pulse: 66  Temp: 36.2 C (97.1 F)  SpO2: 98%  Weight: 82.8 kg (182 lb 9.6 oz)  Height: 177.8 cm ('5\' 10"'$ )   Body mass index is 26.2 kg/m. Physical Exam Constitutional:  Appearance: Normal appearance.  HENT:  Head: Normocephalic and atraumatic.  Nose: Nose normal. No congestion.  Mouth/Throat:  Mouth: Mucous membranes are moist.  Pharynx: Oropharynx is clear.  Eyes:  Pupils: Pupils are equal, round, and reactive to light.  Cardiovascular:  Rate and Rhythm: Normal rate and regular rhythm.  Pulses: Normal pulses.  Heart sounds: Normal heart sounds. No murmur heard. No friction rub. No gallop.  Pulmonary:  Effort: Pulmonary effort is normal. No respiratory distress.  Breath sounds: Normal breath sounds. No stridor. No wheezing, rhonchi or rales.  Abdominal:  General: Abdomen is flat.  Hernia: A hernia is present. Hernia  is present in the right inguinal area.  Musculoskeletal:  General: Normal range of motion.  Cervical back: Normal range of motion.  Skin: General: Skin is warm and dry.  Neurological:  General: No focal deficit present.  Mental Status: He is alert and oriented to person, place, and time.  Psychiatric:   Mood and Affect: Mood normal.  Thought Content: Thought content normal.    Assessment and Plan:  Diagnoses and all orders for this visit:  Non-recurrent unilateral inguinal hernia without obstruction or gangrene   Victor Nixon is a 56 y.o. male   We will proceed to the OR for a laparoscopic right inguinal hernia repair with mesh. All risks and benefits were discussed with the patient, to generally include infection, bleeding, damage to surrounding structures, acute and chronic nerve pain, and recurrence. Alternatives were offered and described. All questions were answered and the patient voiced understanding of the procedure and wishes to proceed at this point. 3. If the patient calls back and feels that he needs surgery done in July we can have him see Dr. Ninfa Linden, Dr. Clement Sayres sheltie or Dr. Kieth Brightly.   Addendum: This gentleman was seen by Dr. Rosendo Gros with a symptomatic right inguinal hernia. Dr. Rosendo Gros will be out until August. The patient would like his surgery done sooner.  Again, he has a symptomatic right inguinal hernia. I again discussed this with him in detail and agree the surgery can be done laparoscopic.  I again discussed proceeding with a laparoscopic right inguinal hernia pair with mesh. He is becoming increasingly symptomatic and having more difficulty reducing the hernia but does not have obstructive symptoms.  I recommend proceeding with surgery as soon as possible.

## 2022-06-11 ENCOUNTER — Encounter (HOSPITAL_COMMUNITY)
Admission: RE | Disposition: A | Discharge status: Home / Self Care | Payer: Self-pay | Source: Home / Self Care | Attending: Surgery

## 2022-06-11 ENCOUNTER — Other Ambulatory Visit: Payer: Self-pay

## 2022-06-11 ENCOUNTER — Encounter (HOSPITAL_COMMUNITY): Payer: Self-pay | Admitting: Surgery

## 2022-06-11 ENCOUNTER — Other Ambulatory Visit: Payer: Self-pay | Admitting: Surgery

## 2022-06-11 ENCOUNTER — Ambulatory Visit (HOSPITAL_COMMUNITY)
Admission: RE | Admit: 2022-06-11 | Discharge: 2022-06-11 | Disposition: A | Discharge status: Home / Self Care | Payer: 59 | Attending: Surgery | Admitting: Surgery

## 2022-06-11 ENCOUNTER — Ambulatory Visit (HOSPITAL_BASED_OUTPATIENT_CLINIC_OR_DEPARTMENT_OTHER): Payer: 59 | Admitting: Anesthesiology

## 2022-06-11 ENCOUNTER — Ambulatory Visit (HOSPITAL_COMMUNITY): Payer: 59 | Admitting: Anesthesiology

## 2022-06-11 DIAGNOSIS — K4090 Unilateral inguinal hernia, without obstruction or gangrene, not specified as recurrent: Secondary | ICD-10-CM | POA: Insufficient documentation

## 2022-06-11 DIAGNOSIS — Z01818 Encounter for other preprocedural examination: Secondary | ICD-10-CM

## 2022-06-11 HISTORY — PX: INGUINAL HERNIA REPAIR: SHX194

## 2022-06-11 SURGERY — REPAIR, HERNIA, INGUINAL, LAPAROSCOPIC
Anesthesia: General | Laterality: Right

## 2022-06-11 MED ORDER — BUPIVACAINE HCL (PF) 0.5 % IJ SOLN
INTRAMUSCULAR | Status: DC | PRN
  Administered 2022-06-11: 20 mL

## 2022-06-11 MED ORDER — FENTANYL CITRATE (PF) 100 MCG/2ML IJ SOLN
INTRAMUSCULAR | Status: DC | PRN
  Administered 2022-06-11 (×3): 50 ug via INTRAVENOUS

## 2022-06-11 MED ORDER — MIDAZOLAM HCL 5 MG/5ML IJ SOLN
INTRAMUSCULAR | Status: DC | PRN
  Administered 2022-06-11: 2 mg via INTRAVENOUS

## 2022-06-11 MED ORDER — MEPERIDINE HCL 50 MG/ML IJ SOLN: 6.2500 mg | INTRAMUSCULAR | Status: DC | PRN

## 2022-06-11 MED ORDER — DEXAMETHASONE SODIUM PHOSPHATE 10 MG/ML IJ SOLN
INTRAMUSCULAR | Status: DC | PRN
  Administered 2022-06-11: 5 mg via INTRAVENOUS

## 2022-06-11 MED ORDER — LIDOCAINE 2% (20 MG/ML) 5 ML SYRINGE
INTRAMUSCULAR | Status: DC | PRN
  Administered 2022-06-11: 80 mg via INTRAVENOUS

## 2022-06-11 MED ORDER — DEXAMETHASONE SODIUM PHOSPHATE 10 MG/ML IJ SOLN
INTRAMUSCULAR | Status: AC
  Filled 2022-06-11: qty 1

## 2022-06-11 MED ORDER — BUPIVACAINE HCL (PF) 0.5 % IJ SOLN
INTRAMUSCULAR | Status: AC
  Filled 2022-06-11: qty 30

## 2022-06-11 MED ORDER — FENTANYL CITRATE (PF) 250 MCG/5ML IJ SOLN
INTRAMUSCULAR | Status: AC
  Filled 2022-06-11: qty 5

## 2022-06-11 MED ORDER — CHLORHEXIDINE GLUCONATE CLOTH 2 % EX PADS: 6.0000 | MEDICATED_PAD | Freq: Once | CUTANEOUS | Status: DC

## 2022-06-11 MED ORDER — CHLORHEXIDINE GLUCONATE 0.12 % MT SOLN
15.0000 mL | Freq: Once | OROMUCOSAL | Status: AC
  Administered 2022-06-11: 15 mL via OROMUCOSAL

## 2022-06-11 MED ORDER — SODIUM CHLORIDE 0.9 % IR SOLN
Status: DC | PRN
  Administered 2022-06-11: 1000 mL

## 2022-06-11 MED ORDER — ONDANSETRON HCL 4 MG/2ML IJ SOLN
INTRAMUSCULAR | Status: AC
  Filled 2022-06-11: qty 2

## 2022-06-11 MED ORDER — HYDROMORPHONE HCL 1 MG/ML IJ SOLN: 0.2500 mg | INTRAMUSCULAR | Status: DC | PRN

## 2022-06-11 MED ORDER — ROCURONIUM BROMIDE 100 MG/10ML IV SOLN
INTRAVENOUS | Status: DC | PRN
  Administered 2022-06-11: 60 mg via INTRAVENOUS
  Administered 2022-06-11: 10 mg via INTRAVENOUS

## 2022-06-11 MED ORDER — MIDAZOLAM HCL 2 MG/2ML IJ SOLN
INTRAMUSCULAR | Status: AC
  Filled 2022-06-11: qty 2

## 2022-06-11 MED ORDER — OXYCODONE HCL 5 MG PO TABS: 5.0000 mg | ORAL_TABLET | Freq: Four times a day (QID) | ORAL | 0 refills | Status: DC | PRN

## 2022-06-11 MED ORDER — OXYCODONE HCL 5 MG PO TABS
ORAL_TABLET | ORAL | Status: AC
  Filled 2022-06-11: qty 1

## 2022-06-11 MED ORDER — ROCURONIUM BROMIDE 10 MG/ML (PF) SYRINGE
PREFILLED_SYRINGE | INTRAVENOUS | Status: AC
  Filled 2022-06-11: qty 10

## 2022-06-11 MED ORDER — LIDOCAINE HCL (PF) 2 % IJ SOLN
INTRAMUSCULAR | Status: AC
  Filled 2022-06-11: qty 5

## 2022-06-11 MED ORDER — DEXMEDETOMIDINE (PRECEDEX) IN NS 20 MCG/5ML (4 MCG/ML) IV SYRINGE
PREFILLED_SYRINGE | INTRAVENOUS | Status: DC | PRN
  Administered 2022-06-11 (×2): 4 ug via INTRAVENOUS

## 2022-06-11 MED ORDER — ONDANSETRON HCL 4 MG/2ML IJ SOLN
INTRAMUSCULAR | Status: DC | PRN
  Administered 2022-06-11: 4 mg via INTRAVENOUS

## 2022-06-11 MED ORDER — PROMETHAZINE HCL 25 MG/ML IJ SOLN: 6.2500 mg | INTRAMUSCULAR | Status: DC | PRN

## 2022-06-11 MED ORDER — KETOROLAC TROMETHAMINE 30 MG/ML IJ SOLN
INTRAMUSCULAR | Status: AC
  Filled 2022-06-11: qty 1

## 2022-06-11 MED ORDER — PROPOFOL 10 MG/ML IV BOLUS
INTRAVENOUS | Status: AC
  Filled 2022-06-11: qty 20

## 2022-06-11 MED ORDER — ORAL CARE MOUTH RINSE: 15.0000 mL | Freq: Once | OROMUCOSAL | Status: AC

## 2022-06-11 MED ORDER — PROPOFOL 10 MG/ML IV BOLUS
INTRAVENOUS | Status: DC | PRN
  Administered 2022-06-11: 200 mg via INTRAVENOUS

## 2022-06-11 MED ORDER — OXYCODONE HCL 5 MG PO TABS
5.0000 mg | ORAL_TABLET | Freq: Once | ORAL | Status: AC
  Administered 2022-06-11: 5 mg via ORAL

## 2022-06-11 MED ORDER — LACTATED RINGERS IV SOLN: INTRAVENOUS | Status: DC

## 2022-06-11 MED ORDER — CIPROFLOXACIN IN D5W 400 MG/200ML IV SOLN
400.0000 mg | INTRAVENOUS | Status: AC
  Administered 2022-06-11: 400 mg via INTRAVENOUS
  Filled 2022-06-11: qty 200

## 2022-06-11 MED ORDER — ENSURE PRE-SURGERY PO LIQD
296.0000 mL | Freq: Once | ORAL | Status: DC
  Filled 2022-06-11: qty 296

## 2022-06-11 MED ORDER — SUGAMMADEX SODIUM 200 MG/2ML IV SOLN
INTRAVENOUS | Status: DC | PRN
  Administered 2022-06-11: 150 mg via INTRAVENOUS

## 2022-06-11 MED ORDER — KETOROLAC TROMETHAMINE 30 MG/ML IJ SOLN
INTRAMUSCULAR | Status: DC | PRN
  Administered 2022-06-11: 30 mg via INTRAVENOUS

## 2022-06-11 MED ORDER — ACETAMINOPHEN 500 MG PO TABS
1000.0000 mg | ORAL_TABLET | ORAL | Status: AC
  Administered 2022-06-11: 1000 mg via ORAL
  Filled 2022-06-11: qty 2

## 2022-06-11 SURGICAL SUPPLY — 30 items
BAG COUNTER SPONGE SURGICOUNT (BAG) ×1 IMPLANT
CHLORAPREP W/TINT 26 (MISCELLANEOUS) ×2 IMPLANT
DERMABOND ADVANCED (GAUZE/BANDAGES/DRESSINGS) ×3
DERMABOND ADVANCED .7 DNX12 (GAUZE/BANDAGES/DRESSINGS) ×3 IMPLANT
DEVICE SECURE STRAP 25 ABSORB (INSTRUMENTS) ×2 IMPLANT
DISSECT BALLN SPACEMKR + OVL (BALLOONS) ×2
DISSECTOR BALLN SPACEMKR + OVL (BALLOONS) ×1 IMPLANT
DISSECTOR BLUNT TIP ENDO 5MM (MISCELLANEOUS) ×1 IMPLANT
ELECT PENCIL ROCKER SW 15FT (MISCELLANEOUS) ×1 IMPLANT
ELECT REM PT RETURN 15FT ADLT (MISCELLANEOUS) ×2 IMPLANT
GLOVE BIO SURGEON STRL SZ7.5 (GLOVE) ×2 IMPLANT
GOWN STRL REUS W/ TWL XL LVL3 (GOWN DISPOSABLE) ×2 IMPLANT
GOWN STRL REUS W/TWL XL LVL3 (GOWN DISPOSABLE) ×2
IRRIG SUCT STRYKERFLOW 2 WTIP (MISCELLANEOUS)
IRRIGATION SUCT STRKRFLW 2 WTP (MISCELLANEOUS) IMPLANT
KIT BASIN OR (CUSTOM PROCEDURE TRAY) ×2 IMPLANT
KIT TURNOVER KIT A (KITS) ×1 IMPLANT
MARKER SKIN DUAL TIP RULER LAB (MISCELLANEOUS) ×2 IMPLANT
MESH 3DMAX 4X6 RT LRG (Mesh General) ×1 IMPLANT
NDL INSUFFLATION 14GA 120MM (NEEDLE) IMPLANT
NEEDLE INSUFFLATION 14GA 120MM (NEEDLE) IMPLANT
SCISSORS LAP 5X35 DISP (ENDOMECHANICALS) ×2 IMPLANT
SET TUBE SMOKE EVAC HIGH FLOW (TUBING) ×2 IMPLANT
SPIKE FLUID TRANSFER (MISCELLANEOUS) ×2 IMPLANT
SUT MNCRL AB 4-0 PS2 18 (SUTURE) ×2 IMPLANT
TOWEL OR 17X26 10 PK STRL BLUE (TOWEL DISPOSABLE) ×2 IMPLANT
TOWEL OR NON WOVEN STRL DISP B (DISPOSABLE) ×2 IMPLANT
TRAY LAPAROSCOPIC (CUSTOM PROCEDURE TRAY) ×2 IMPLANT
TROCAR OPTICAL SHORT 5MM (TROCAR) IMPLANT
TROCAR OPTICAL SLV SHORT 5MM (TROCAR) ×2 IMPLANT

## 2022-06-11 NOTE — Interval H&P Note (Signed)
History and Physical Interval Note: no change in H and P  06/11/2022 7:08 AM  Victor Nixon  has presented today for surgery, with the diagnosis of RIGHT INGUINAL HERNIA.  The various methods of treatment have been discussed with the patient and family. After consideration of risks, benefits and other options for treatment, the patient has consented to  Procedure(s): LAPAROSCOPIC RIGHT INGUINAL HERNIA (Right) as a surgical intervention.  The patient's history has been reviewed, patient examined, no change in status, stable for surgery.  I have reviewed the patient's chart and labs.  Questions were answered to the patient's satisfaction.     Coralie Keens

## 2022-06-11 NOTE — Anesthesia Procedure Notes (Signed)
Procedure Name: Intubation Date/Time: 06/11/2022 8:46 AM  Performed by: Gwyndolyn Saxon, CRNAPre-anesthesia Checklist: Patient identified, Emergency Drugs available, Suction available and Patient being monitored Patient Re-evaluated:Patient Re-evaluated prior to induction Oxygen Delivery Method: Circle system utilized Preoxygenation: Pre-oxygenation with 100% oxygen Induction Type: IV induction Ventilation: Mask ventilation without difficulty Laryngoscope Size: Miller and 2 Grade View: Grade I Tube type: Oral Tube size: 7.5 mm Number of attempts: 1 Airway Equipment and Method: Patient positioned with wedge pillow and Stylet Placement Confirmation: ETT inserted through vocal cords under direct vision, positive ETCO2 and breath sounds checked- equal and bilateral Secured at: 23 cm Tube secured with: Tape Dental Injury: Teeth and Oropharynx as per pre-operative assessment

## 2022-06-11 NOTE — Discharge Instructions (Signed)
[facility] _______[facility]  UMBILICAL OR INGUINAL HERNIA REPAIR: POST OP INSTRUCTIONS  Always review your discharge instruction sheet given to you by the facility where your surgery was performed. IF YOU HAVE DISABILITY OR FAMILY LEAVE FORMS, YOU MUST BRING THEM TO THE OFFICE FOR PROCESSING.   DO NOT GIVE THEM TO YOUR DOCTOR.  1. A  prescription for pain medication may be given to you upon discharge.  Take your pain medication as prescribed, if needed.  If narcotic pain medicine is not needed, then you may take acetaminophen (Tylenol) or ibuprofen (Advil) as needed. 2. Take your usually prescribed medications unless otherwise directed. If you need a refill on your pain medication, please contact your pharmacy.  They will contact our office to request authorization. Prescriptions will not be filled after 5 pm or on week-ends. 3. You should follow a light diet the first 24 hours after arrival home, such as soup and crackers, etc.  Be sure to include lots of fluids daily.  Resume your normal diet the day after surgery. 4.Most patients will experience some swelling and bruising around the umbilicus or in the groin and scrotum.  Ice packs and reclining will help.  Swelling and bruising can take several days to resolve.  6. It is common to experience some constipation if taking pain medication after surgery.  Increasing fluid intake and taking a stool softener (such as Colace) will usually help or prevent this problem from occurring.  A mild laxative (Milk of Magnesia or Miralax) should be taken according to package directions if there are no bowel movements after 48 hours. 7. Unless discharge instructions indicate otherwise, you may remove your bandages 24-48 hours after surgery, and you may shower at that time.  You may have steri-strips (small skin tapes) in place directly over the incision.  These strips should be left on the skin for 7-10 days.  If your surgeon used skin glue on the  incision, you may shower in 24 hours.  The glue will flake off over the next 2-3 weeks.  Any sutures or staples will be removed at the office during your follow-up visit. 8. ACTIVITIES:  You may resume regular (light) daily activities beginning the next day--such as daily self-care, walking, climbing stairs--gradually increasing activities as tolerated.  You may have sexual intercourse when it is comfortable.  Refrain from any heavy lifting or straining until approved by your doctor.  a.You may drive when you are no longer taking prescription pain medication, you can comfortably wear a seatbelt, and you can safely maneuver your car and apply brakes. b.RETURN TO WORK:   _____________________________________________  9.You should see your doctor in the office for a follow-up appointment approximately 2-3 weeks after your surgery.  Make sure that you call for this appointment within a day or two after you arrive home to insure a convenient appointment time. 10.OTHER INSTRUCTIONS: _OK TO SHOWER STARTING TOMORROW ICE PACK, TYLENOL, AND IBUPROFEN ALSO FOR PAIN NO LIFTING MORE THAN 15 POUNDS FOR 4 WEEKS________________________    _____________________________________  WHEN TO CALL YOUR DOCTOR: Fever over 101.0 Inability to urinate Nausea and/or vomiting Extreme swelling or bruising Continued bleeding from incision. Increased pain, redness, or drainage from the incision  The clinic staff is available to answer your questions during regular business hours.  Please don't hesitate to call and ask to speak to one of the nurses for clinical concerns.  If you have a medical emergency, go to the nearest emergency room or call 911.    A surgeon from [facility] is always on call at the hospital   [address] [place], [place], [place]  [address] ?  [address], [place], [place]   [address] [phone] ? [phone] ? FAX [phone] Web site: [email or web address]

## 2022-06-11 NOTE — Anesthesia Postprocedure Evaluation (Signed)
Anesthesia Post Note  Patient: Victor Nixon  Procedure(s) Performed: LAPAROSCOPIC RIGHT INGUINAL HERNIA (Right)     Patient location during evaluation: PACU Anesthesia Type: General Level of consciousness: sedated and patient cooperative Pain management: pain level controlled Vital Signs Assessment: post-procedure vital signs reviewed and stable Respiratory status: spontaneous breathing Cardiovascular status: stable Anesthetic complications: no   No notable events documented.  Last Vitals:  Vitals:   06/11/22 1030 06/11/22 1045  BP: 124/76 131/83  Pulse: 75 (!) 57  Resp:    Temp:    SpO2: 96% 100%    Last Pain:  Vitals:   06/11/22 1045  TempSrc:   PainSc: [place]

## 2022-06-11 NOTE — Transfer of Care (Signed)
Immediate Anesthesia Transfer of Care Note  Patient: Victor Nixon  Procedure(s) Performed: LAPAROSCOPIC RIGHT INGUINAL HERNIA (Right)  Patient Location: PACU  Anesthesia Type:General  Level of Consciousness: drowsy and patient cooperative  Airway & Oxygen Therapy: Patient Spontanous Breathing  Post-op Assessment: Report given to RN and Post -op Vital signs reviewed and stable  Post vital signs: Reviewed and stable  Last Vitals:  Vitals Value Taken Time  BP 131/84 06/11/22 0934  Temp    Pulse 78 06/11/22 0934  Resp 16 06/11/22 0934  SpO2 98 % 06/11/22 0934  Vitals shown include unvalidated device data.  Last Pain:  Vitals:   06/11/22 0705  TempSrc:   PainSc: 1       Patients Stated Pain Goal: 4 (96/29/52 8413)  Complications: No notable events documented.

## 2022-06-11 NOTE — Op Note (Signed)
   Victor Nixon 06/11/2022   Pre-op Diagnosis: RIGHT INGUINAL HERNIA     Post-op Diagnosis: same  Procedure(s): LAPAROSCOPIC RIGHT INGUINAL HERNIA REPAIR WITH MESH  Surgeon(s): Coralie Keens, MD  Anesthesia: General  Staff:  Circulator: Thana Ates, RN Scrub Person: Sanda Klein; Vernia Buff, RN  Estimated Blood Loss: Minimal               Findings: The patient was found to have a large right indirect inguinal hernia and a smaller right direct inguinal hernia.  This was repaired with a large piece of Prolene 3 DMax mesh from [employer]  Procedure: The patient brought to the operating identifies correct patient.  He was placed upon the operating table general anesthesia was induced.  His abdomen was prepped and draped in the usual sterile fashion.  I made a small vertical incision below the midline with a scalpel.  I carried this down to the fascia which was opened just to the right of the midline.  The rectus muscle was then elevated.  I passed the dissecting balloon underneath the rectus muscle and manipulated toward the pubis.  The dissecting balloon was then insufflated under direct vision dissecting out the preperitoneal space.  I then removed the dissecting balloon and insufflation was begun with carbon dioxide.  I next placed two 5 mm trocars in the patient's lower midline both under direct vision.  I then dissected out the right inguinal area.  The patient had a small direct hernia defect.  He also had a large indirect hernia sac.  All contents have been used from the sac.  I was then able to dissect the sac completely off of the testicular cord and structures.  Next a piece of 3D max large Prolene mesh was brought to the field.  A right-sided piece of mesh was used.  I placed it through the umbilical trocar and then opened as an onlay on the right inguinal floor.  I then tacked the mesh to Cooper's ligament, of the medial abdominal wall, and slightly laterally  with the absorbable tacker.  Wide coverage of the cord structures and direct defect appeared to be achieved.  Hemostasis appeared to be achieved.  The old hernia sac was held out of the way as the trocars were removed and the preperitoneal space was collapsed with the mesh remaining in an appropriate position.  We then removed the trocar at the umbilicus.  The fascial umbilicus closed with a figure-of-eight 0 Vicryl suture.  I then anesthetized all incisions with Marcaine and performed a right ilioinguinal nerve block with Marcaine as well.  All skin incisions were then closed with 4-0 Monocryl and Dermabond.  The patient tolerated the procedure well.  All the counts were correct at the end of the procedure.  The patient was then extubated in the operating room and taken in a stable condition to the recovery room          Coralie Keens   Date: 06/11/2022  Time: 9:28 AM

## 2022-06-11 NOTE — Anesthesia Preprocedure Evaluation (Signed)
Anesthesia Evaluation  Patient identified by MRN, date of birth, ID band Patient awake    Reviewed: Allergy & Precautions, H&P , NPO status , Patient's Chart, lab work & pertinent test results  Airway Mallampati: II  TM Distance: >3 FB Neck ROM: full    Dental no notable dental hx. (+) Dental Advisory Given   Pulmonary neg pulmonary ROS,    Pulmonary exam normal breath sounds clear to auscultation       Cardiovascular Exercise Tolerance: Good + Valvular Problems/Murmurs  Rhythm:regular Rate:Normal     Neuro/Psych negative neurological ROS  negative psych ROS   GI/Hepatic negative GI ROS, Neg liver ROS,   Endo/Other  negative endocrine ROS  Renal/GU negative Renal ROS  negative genitourinary   Musculoskeletal   Abdominal   Peds  Hematology negative hematology ROS (+)   Anesthesia Other Findings   Reproductive/Obstetrics negative OB ROS                             Anesthesia Physical  Anesthesia Plan  ASA: 2  Anesthesia Plan: General   Post-op Pain Management: Tylenol PO (pre-op)*   Induction: Intravenous  PONV Risk Score and Plan: 4 or greater and Ondansetron, Dexamethasone, Treatment may vary due to age or medical condition and Midazolam  Airway Management Planned: Oral ETT  Additional Equipment:   Intra-op Plan:   Post-operative Plan: Extubation in OR  Informed Consent: I have reviewed the patients History and Physical, chart, labs and discussed the procedure including the risks, benefits and alternatives for the proposed anesthesia with the patient or authorized representative who has indicated his/her understanding and acceptance.     Dental advisory given  Plan Discussed with: CRNA  Anesthesia Plan Comments:         Anesthesia Quick Evaluation

## 2022-06-12 ENCOUNTER — Encounter (HOSPITAL_COMMUNITY): Payer: Self-pay | Admitting: Surgery

## 2022-06-27 ENCOUNTER — Telehealth: Payer: Self-pay | Admitting: Emergency Medicine

## 2022-06-27 DIAGNOSIS — E785 Hyperlipidemia, unspecified: Secondary | ICD-10-CM

## 2022-06-27 MED ORDER — ROSUVASTATIN CALCIUM 10 MG PO TABS: 10.0000 mg | ORAL_TABLET | Freq: Every day | ORAL | 3 refills | Status: DC

## 2022-06-27 NOTE — Telephone Encounter (Signed)
Caller & Relationship to patient: Victor Nixon  Call back number: [phone]  Date of last office visit: 12/10/21  Date of next office visit: 12/10/22  Medication(s) to be refilled:  rosuvastatin (CRESTOR) 10 MG tablet  Preferred Pharmacy:  [facility] ) - [place], [place] Phone:  [phone]  Fax:  [phone]

## 2022-06-27 NOTE — Telephone Encounter (Signed)
New prescription sent in to new requested pharmacy

## 2022-07-22 ENCOUNTER — Ambulatory Visit: Payer: Managed Care, Other (non HMO) | Admitting: Emergency Medicine

## 2022-07-22 ENCOUNTER — Encounter: Payer: Self-pay | Admitting: Emergency Medicine

## 2022-07-22 VITALS — BP 124/88 | HR 66 | Temp 98.1°F | Ht 70.0 in | Wt 187.1 lb

## 2022-07-22 DIAGNOSIS — J029 Acute pharyngitis, unspecified: Secondary | ICD-10-CM

## 2022-07-22 DIAGNOSIS — J36 Peritonsillar abscess: Secondary | ICD-10-CM | POA: Diagnosis not present

## 2022-07-22 NOTE — Progress Notes (Signed)
Victor Nixon 56 y.o.   Chief Complaint  Patient presents with   spot in the back of his throat    Soreness on left side of throat x 2-3 weeks     HISTORY OF PRESENT ILLNESS: This is a 56 y.o. male A1A complaining of left-sided sore throat that started about 3 weeks ago. Was recently hospitalized for right inguinal hernia repair and was endotracheally intubated Went to urgent care center 2 days ago.  Negative rapid strep test Denies fever or chills.  Able to swallow okay.  Denies nausea or vomiting. Noticed what looks like a canker sore on left side of oropharynx.  HPI   Prior to Admission medications   Medication Sig Start Date End Date Taking? Authorizing Provider  Aspirin-Acetaminophen-Caffeine (GOODY HEADACHE PO) Take 1 packet by mouth daily as needed (pain).    [provider]  Omega-3 Fatty Acids (FISH OIL) 1200 MG CAPS Take 2,400 mg by mouth daily.    [provider]  oxyCODONE (OXY IR/ROXICODONE) 5 MG immediate release tablet Take 1 tablet (5 mg total) by mouth every 6 (six) hours as needed for moderate pain, severe pain or breakthrough pain. 06/11/22   Coralie Keens, MD  rosuvastatin (CRESTOR) 10 MG tablet Take 1 tablet (10 mg total) by mouth daily. 06/27/22   Horald Pollen, MD    Allergies  Allergen Reactions   Cephalexin Rash    Patient Active Problem List   Diagnosis Date Noted   Left lower quadrant abdominal pain 04/27/2022   Screening for prostate cancer 04/27/2022   Right inguinal hernia 04/27/2022   HLD (hyperlipidemia) 04/24/2022   Family history of melanoma 02/18/2016    Past Medical History:  Diagnosis Date   Allergy    seasonal allergies   Broken arm (left arm) childhood   Heart murmur    Stable - not audible 5 yrs ago   History of kidney stones    Hyperlipidemia     Past Surgical History:  Procedure Laterality Date   HERNIA REPAIR     age 29 or 47   INGUINAL HERNIA REPAIR Right 06/11/2022   Procedure:  LAPAROSCOPIC RIGHT INGUINAL HERNIA;  Surgeon: Coralie Keens, MD;  Location: [facility];  Service: General;  Laterality: Right;   NEPHROLITHOTOMY  06/21/2012   Procedure: NEPHROLITHOTOMY PERCUTANEOUS;  Surgeon: Claybon Jabs, MD;  Location: [facility];  Service: Urology;  Laterality: Left;  left ureteral stent    Social History   Socioeconomic History   Marital status: Married    Spouse name: Not on file   Number of children: 1   Years of education: Not on file   Highest education level: Not on file  Occupational History   Occupation: sales  Tobacco Use   Smoking status: Never   Smokeless tobacco: Never  Vaping Use   Vaping Use: Never used  Substance and Sexual Activity   Alcohol use: Yes    Alcohol/week: 1.0 - 2.0 standard drink of alcohol    Types: 1 - 2 Glasses of wine per week    Comment: occassional beer, liquor   Drug use: No   Sexual activity: Yes    Partners: Female    Birth control/protection: Abstinence  Other Topics Concern   Not on file  Social History Narrative   Not on file   Social Determinants of Health   Financial Resource Strain: Not on file  Food Insecurity: Not on file  Transportation Needs: Not on file  Physical Activity: Not on  file  Stress: Not on file  Social Connections: Not on file  Intimate Partner Violence: Not on file    Family History  Problem Relation Age of Onset   Hip fracture Mother    Cancer Father        melanoma   Heart disease Father        heart valve replacement   Colon cancer Neg Hx    Esophageal cancer Neg Hx    Rectal cancer Neg Hx    Stomach cancer Neg Hx    Colon polyps Neg Hx      Review of Systems  Constitutional: Negative.  Negative for chills, fever and weight loss.  HENT:  Positive for sore throat.   Respiratory: Negative.  Negative for cough and shortness of breath.   Cardiovascular: Negative.  Negative for chest pain and palpitations.  Gastrointestinal:  Negative for abdominal pain, diarrhea, nausea and  vomiting.  Genitourinary: Negative.   Musculoskeletal: Negative.   Skin: Negative.  Negative for rash.  Neurological: Negative.  Negative for dizziness and headaches.  All other systems reviewed and are negative.  Today's Vitals   07/22/22 1014  BP: 124/88  Pulse: 66  Temp: 98.1 F (36.7 C)  TempSrc: Oral  SpO2: 97%  Weight: 187 lb 2 oz (84.9 kg)  Height: '5\' 10"'$  (1.778 m)   Body mass index is 26.85 kg/m.   Physical Exam Vitals reviewed.  Constitutional:      Appearance: Normal appearance.  HENT:     Head: Normocephalic.     Mouth/Throat:     Mouth: Mucous membranes are moist.     Pharynx: No oropharyngeal exudate.     Comments: Enlarged left tonsil with canker sore.  Suspected abscess Eyes:     Extraocular Movements: Extraocular movements intact.  Cardiovascular:     Rate and Rhythm: Normal rate.  Pulmonary:     Effort: Pulmonary effort is normal.  Musculoskeletal:     Cervical back: No tenderness.  Lymphadenopathy:     Cervical: No cervical adenopathy.  Skin:    General: Skin is warm and dry.  Neurological:     General: No focal deficit present.     Mental Status: He is alert and oriented to person, place, and time.  Psychiatric:        Mood and Affect: Mood normal.        Behavior: Behavior normal.      ASSESSMENT & PLAN: A total of 32 minutes was spent with the patient and counseling/coordination of care regarding preparing for this visit, review of most recent office visit notes, differential diagnosis of left tonsillar mass including possibility of abscess, need for ENT evaluation, prognosis, documentation, need for follow-up.  Problem List Items Addressed This Visit       Respiratory   Tonsillar abscess - Primary    Of almost 3-week duration and not getting better. Differential diagnosis discussed. Recent negative strep test. Non-smoker.  No EtOH abuser. Needs ENT evaluation. Referral placed today.      Relevant Orders   Ambulatory referral  to ENT     Other   Sore throat   Patient Instructions  Sore Throat When you have a sore throat, your throat may feel: Tender. Burning. Irritated. Scratchy. Painful when you swallow. Painful when you talk. Many things can cause a sore throat, such as: An infection. Allergies. Dry air. Smoke or pollution. Radiation treatment for cancer. Gastroesophageal reflux disease (GERD). A tumor. A sore throat can be the first sign  of another sickness. It can happen with other problems, like: Coughing. Sneezing. Fever. Swelling of the glands in the neck. Most sore throats go away without treatment. Follow these instructions at home:     Medicines Take over-the-counter and prescription medicines only as told by your doctor. Children often get sore throats. Do not give your child aspirin. Use throat sprays to soothe your throat as told by your health care provider. Managing pain To help with pain: Sip warm liquids, such as broth, herbal tea, or warm water. Eat or drink cold or frozen liquids, such as frozen ice pops. Rinse your mouth (gargle) with a salt water mixture 3-4 times a day or as needed. To make salt water, dissolve -1 tsp (3-6 g) of salt in 1 cup (237 mL) of warm water. Do not swallow this mixture. Suck on hard candy or throat lozenges. Put a cool-mist humidifier in your bedroom at night. Sit in the bathroom with the door closed for 5-10 minutes while you run hot water in the shower. General instructions Do not smoke or use any products that contain nicotine or tobacco. If you need help quitting, ask your doctor. Get plenty of rest. Drink enough fluid to keep your pee (urine) pale yellow. Wash your hands often for at least 20 seconds with soap and water. If soap and water are not available, use hand sanitizer. Contact a doctor if: You have a fever for more than 2-3 days. You keep having symptoms for more than 2-3 days. Your throat does not get better in 7 days. You  have a fever and your symptoms suddenly get worse. Your child who is 3 months to 38 years old has a temperature of 102.75F (39C) or higher. Get help right away if: You have trouble breathing. You cannot swallow fluids, soft foods, or your spit. You have swelling in your throat or neck that gets worse. You feel like you may vomit (nauseous) and this feeling lasts a long time. You cannot stop vomiting. These symptoms may be an emergency. Get help right away. Call your local emergency services (911 in the [place] Do not wait to see if the symptoms will go away. Do not drive yourself to the hospital. Summary A sore throat is a painful, burning, irritated, or scratchy throat. Many things can cause a sore throat. Take over-the-counter medicines only as told by your doctor. Get plenty of rest. Drink enough fluid to keep your pee (urine) pale yellow. Contact a doctor if your symptoms get worse or your sore throat does not get better within 7 days. This information is not intended to replace advice given to you by your health care provider. Make sure you discuss any questions you have with your health care provider. Document Revised: 02/06/2021 Document Reviewed: 02/06/2021 Elsevier Patient Education  Swartz, MD [facility]

## 2022-07-22 NOTE — Assessment & Plan Note (Addendum)
Of almost 3-week duration and not getting better. Differential diagnosis discussed. Recent negative strep test. Non-smoker.  No EtOH abuser. Needs ENT evaluation. Referral placed today.

## 2022-07-22 NOTE — Patient Instructions (Signed)
Sore Throat When you have a sore throat, your throat may feel: Tender. Burning. Irritated. Scratchy. Painful when you swallow. Painful when you talk. Many things can cause a sore throat, such as: An infection. Allergies. Dry air. Smoke or pollution. Radiation treatment for cancer. Gastroesophageal reflux disease (GERD). A tumor. A sore throat can be the first sign of another sickness. It can happen with other problems, like: Coughing. Sneezing. Fever. Swelling of the glands in the neck. Most sore throats go away without treatment. Follow these instructions at home:     Medicines Take over-the-counter and prescription medicines only as told by your doctor. Children often get sore throats. Do not give your child aspirin. Use throat sprays to soothe your throat as told by your health care provider. Managing pain To help with pain: Sip warm liquids, such as broth, herbal tea, or warm water. Eat or drink cold or frozen liquids, such as frozen ice pops. Rinse your mouth (gargle) with a salt water mixture 3-4 times a day or as needed. To make salt water, dissolve -1 tsp (3-6 g) of salt in 1 cup (237 mL) of warm water. Do not swallow this mixture. Suck on hard candy or throat lozenges. Put a cool-mist humidifier in your bedroom at night. Sit in the bathroom with the door closed for 5-10 minutes while you run hot water in the shower. General instructions Do not smoke or use any products that contain nicotine or tobacco. If you need help quitting, ask your doctor. Get plenty of rest. Drink enough fluid to keep your pee (urine) pale yellow. Wash your hands often for at least 20 seconds with soap and water. If soap and water are not available, use hand sanitizer. Contact a doctor if: You have a fever for more than 2-3 days. You keep having symptoms for more than 2-3 days. Your throat does not get better in 7 days. You have a fever and your symptoms suddenly get worse. Your  child who is 3 months to 3 years old has a temperature of 102.2F (39C) or higher. Get help right away if: You have trouble breathing. You cannot swallow fluids, soft foods, or your spit. You have swelling in your throat or neck that gets worse. You feel like you may vomit (nauseous) and this feeling lasts a long time. You cannot stop vomiting. These symptoms may be an emergency. Get help right away. Call your local emergency services (911 in the [place] Do not wait to see if the symptoms will go away. Do not drive yourself to the hospital. Summary A sore throat is a painful, burning, irritated, or scratchy throat. Many things can cause a sore throat. Take over-the-counter medicines only as told by your doctor. Get plenty of rest. Drink enough fluid to keep your pee (urine) pale yellow. Contact a doctor if your symptoms get worse or your sore throat does not get better within 7 days. This information is not intended to replace advice given to you by your health care provider. Make sure you discuss any questions you have with your health care provider. Document Revised: 02/06/2021 Document Reviewed: 02/06/2021 Elsevier Patient Education  2023 Elsevier Inc.  

## 2022-12-10 ENCOUNTER — Ambulatory Visit: Payer: 59 | Admitting: Emergency Medicine

## 2022-12-15 ENCOUNTER — Encounter: Payer: 59 | Admitting: Emergency Medicine

## 2022-12-31 ENCOUNTER — Ambulatory Visit: Payer: Managed Care, Other (non HMO) | Admitting: Emergency Medicine

## 2022-12-31 ENCOUNTER — Encounter: Payer: Self-pay | Admitting: Emergency Medicine

## 2022-12-31 VITALS — BP 110/66 | HR 64 | Temp 97.9°F | Ht 70.0 in | Wt 186.5 lb

## 2022-12-31 DIAGNOSIS — Z13220 Encounter for screening for lipoid disorders: Secondary | ICD-10-CM | POA: Diagnosis not present

## 2022-12-31 DIAGNOSIS — Z1329 Encounter for screening for other suspected endocrine disorder: Secondary | ICD-10-CM

## 2022-12-31 DIAGNOSIS — Z13228 Encounter for screening for other metabolic disorders: Secondary | ICD-10-CM

## 2022-12-31 DIAGNOSIS — Z0000 Encounter for general adult medical examination without abnormal findings: Secondary | ICD-10-CM | POA: Diagnosis not present

## 2022-12-31 DIAGNOSIS — Z130 Encounter for screening for diseases of the blood and blood-forming organs and certain disorders involving the immune mechanism: Secondary | ICD-10-CM

## 2022-12-31 DIAGNOSIS — Z131 Encounter for screening for diabetes mellitus: Secondary | ICD-10-CM | POA: Diagnosis not present

## 2022-12-31 DIAGNOSIS — Z125 Encounter for screening for malignant neoplasm of prostate: Secondary | ICD-10-CM | POA: Diagnosis not present

## 2022-12-31 LAB — COMPREHENSIVE METABOLIC PANEL
ALT: 19 U/L (ref 0–53)
AST: 20 U/L (ref 0–37)
Albumin: 4.8 g/dL (ref 3.5–5.2)
Alkaline Phosphatase: 63 U/L (ref 39–117)
BUN: 13 mg/dL (ref 6–23)
CO2: 29 mEq/L (ref 19–32)
Calcium: 9.6 mg/dL (ref 8.4–10.5)
Chloride: 102 mEq/L (ref 96–112)
Creatinine, Ser: 0.99 mg/dL (ref 0.40–1.50)
GFR: 86.51 mL/min (ref >60.00)
Glucose, Bld: 85 mg/dL (ref 70–99)
Potassium: 4.9 mEq/L (ref 3.5–5.1)
Sodium: 138 mEq/L (ref 135–145)
Total Bilirubin: 0.7 mg/dL (ref 0.2–1.2)
Total Protein: 7.6 g/dL (ref 6.0–8.3)

## 2022-12-31 LAB — CBC WITH DIFFERENTIAL/PLATELET
Basophils Absolute: 0.1 10*3/uL (ref 0.0–0.1)
Basophils Relative: 1.1 % (ref 0.0–3.0)
Eosinophils Absolute: 0.1 10*3/uL (ref 0.0–0.7)
Eosinophils Relative: 1.9 % (ref 0.0–5.0)
HCT: 42.6 % (ref 39.0–52.0)
Hemoglobin: 14.4 g/dL (ref 13.0–17.0)
Lymphocytes Relative: 33.1 % (ref 12.0–46.0)
Lymphs Abs: 2 10*3/uL (ref 0.7–4.0)
MCHC: 33.8 g/dL (ref 30.0–36.0)
MCV: 90.3 fl (ref 78.0–100.0)
Monocytes Absolute: 0.6 10*3/uL (ref 0.1–1.0)
Monocytes Relative: 9.7 % (ref 3.0–12.0)
Neutro Abs: 3.3 10*3/uL (ref 1.4–7.7)
Neutrophils Relative %: 54.2 % (ref 43.0–77.0)
Platelets: 425 10*3/uL — ABNORMAL HIGH (ref 150.0–400.0)
RBC: 4.72 Mil/uL (ref 4.22–5.81)
RDW: 13.6 % (ref 11.5–15.5)
WBC: 6.2 10*3/uL (ref 4.0–10.5)

## 2022-12-31 LAB — LIPID PANEL
Cholesterol: 200 mg/dL (ref 0–200)
HDL: 64.3 mg/dL (ref >39.00)
LDL Cholesterol: 108 mg/dL — ABNORMAL HIGH (ref 0–99)
NonHDL: 135.27
Total CHOL/HDL Ratio: 3
Triglycerides: 136 mg/dL (ref 0.0–149.0)
VLDL: 27.2 mg/dL (ref 0.0–40.0)

## 2022-12-31 LAB — PSA: PSA: 0.38 ng/mL (ref 0.10–4.00)

## 2022-12-31 LAB — HEMOGLOBIN A1C: Hgb A1c MFr Bld: 5.6 % (ref 4.6–6.5)

## 2022-12-31 NOTE — Patient Instructions (Signed)
Health Maintenance, Male Adopting a healthy lifestyle and getting preventive care are important in promoting health and wellness. Ask your health care provider about: The right schedule for you to have regular tests and exams. Things you can do on your own to prevent diseases and keep yourself healthy. What should I know about diet, weight, and exercise? Eat a healthy diet  Eat a diet that includes plenty of vegetables, fruits, low-fat dairy products, and lean protein. Do not eat a lot of foods that are high in solid fats, added sugars, or sodium. Maintain a healthy weight Body mass index (BMI) is a measurement that can be used to identify possible weight problems. It estimates body fat based on height and weight. Your health care provider can help determine your BMI and help you achieve or maintain a healthy weight. Get regular exercise Get regular exercise. This is one of the most important things you can do for your health. Most adults should: Exercise for at least 150 minutes each week. The exercise should increase your heart rate and make you sweat (moderate-intensity exercise). Do strengthening exercises at least twice a week. This is in addition to the moderate-intensity exercise. Spend less time sitting. Even light physical activity can be beneficial. Watch cholesterol and blood lipids Have your blood tested for lipids and cholesterol at 55 years of age, then have this test every 5 years. You may need to have your cholesterol levels checked more often if: Your lipid or cholesterol levels are high. You are older than 55 years of age. You are at high risk for heart disease. What should I know about cancer screening? Many types of cancers can be detected early and may often be prevented. Depending on your health history and family history, you may need to have cancer screening at various ages. This may include screening for: Colorectal cancer. Prostate cancer. Skin cancer. Lung  cancer. What should I know about heart disease, diabetes, and high blood pressure? Blood pressure and heart disease High blood pressure causes heart disease and increases the risk of stroke. This is more likely to develop in people who have high blood pressure readings or are overweight. Talk with your health care provider about your target blood pressure readings. Have your blood pressure checked: Every 3-5 years if you are 18-39 years of age. Every year if you are 40 years old or older. If you are between the ages of 65 and 75 and are a current or former smoker, ask your health care provider if you should have a one-time screening for abdominal aortic aneurysm (AAA). Diabetes Have regular diabetes screenings. This checks your fasting blood sugar level. Have the screening done: Once every three years after age 45 if you are at a normal weight and have a low risk for diabetes. More often and at a younger age if you are overweight or have a high risk for diabetes. What should I know about preventing infection? Hepatitis B If you have a higher risk for hepatitis B, you should be screened for this virus. Talk with your health care provider to find out if you are at risk for hepatitis B infection. Hepatitis C Blood testing is recommended for: Everyone born from 1945 through 1965. Anyone with known risk factors for hepatitis C. Sexually transmitted infections (STIs) You should be screened each year for STIs, including gonorrhea and chlamydia, if: You are sexually active and are younger than 55 years of age. You are older than 55 years of age and your   health care provider tells you that you are at risk for this type of infection. Your sexual activity has changed since you were last screened, and you are at increased risk for chlamydia or gonorrhea. Ask your health care provider if you are at risk. Ask your health care provider about whether you are at high risk for HIV. Your health care provider  may recommend a prescription medicine to help prevent HIV infection. If you choose to take medicine to prevent HIV, you should first get tested for HIV. You should then be tested every 3 months for as long as you are taking the medicine. Follow these instructions at home: Alcohol use Do not drink alcohol if your health care provider tells you not to drink. If you drink alcohol: Limit how much you have to 0-2 drinks a day. Know how much alcohol is in your drink. In the [place] one drink equals one 12 oz bottle of beer (355 mL), one 5 oz glass of wine (148 mL), or one 1 oz glass of hard liquor (44 mL). Lifestyle Do not use any products that contain nicotine or tobacco. These products include cigarettes, chewing tobacco, and vaping devices, such as e-cigarettes. If you need help quitting, ask your health care provider. Do not use street drugs. Do not share needles. Ask your health care provider for help if you need support or information about quitting drugs. General instructions Schedule regular health, dental, and eye exams. Stay current with your vaccines. Tell your health care provider if: You often feel depressed. You have ever been abused or do not feel safe at home. Summary Adopting a healthy lifestyle and getting preventive care are important in promoting health and wellness. Follow your health care provider's instructions about healthy diet, exercising, and getting tested or screened for diseases. Follow your health care provider's instructions on monitoring your cholesterol and blood pressure. This information is not intended to replace advice given to you by your health care provider. Make sure you discuss any questions you have with your health care provider. Document Revised: 04/01/2021 Document Reviewed: 04/01/2021 Elsevier Patient Education  2023 [employer] Inc.  

## 2022-12-31 NOTE — Progress Notes (Signed)
Victor Nixon 57 y.o.   Chief Complaint  Patient presents with   Annual Exam    Patient requesting labs     HISTORY OF PRESENT ILLNESS: This is a 57 y.o. male A1A here for annual exam. Overall doing well.  Eating better and exercising more. Has no complaints or medical concerns today.  HPI   Prior to Admission medications   Medication Sig Start Date End Date Taking? Authorizing Provider  Omega-3 Fatty Acids (FISH OIL) 1200 MG CAPS Take 2,400 mg by mouth daily.   Yes [provider]  rosuvastatin (CRESTOR) 10 MG tablet Take 1 tablet (10 mg total) by mouth daily. 06/27/22  Yes Horald Pollen, MD    Allergies  Allergen Reactions   Cephalexin Rash    Patient Active Problem List   Diagnosis Date Noted   HLD (hyperlipidemia) 04/24/2022   Family history of melanoma 02/18/2016    Past Medical History:  Diagnosis Date   Allergy    seasonal allergies   Broken arm (left arm) childhood   Heart murmur    Stable - not audible 5 yrs ago   History of kidney stones    Hyperlipidemia     Past Surgical History:  Procedure Laterality Date   HERNIA REPAIR     age 43 or 43   INGUINAL HERNIA REPAIR Right 06/11/2022   Procedure: LAPAROSCOPIC RIGHT INGUINAL HERNIA;  Surgeon: Coralie Keens, MD;  Location: [facility];  Service: General;  Laterality: Right;   NEPHROLITHOTOMY  06/21/2012   Procedure: NEPHROLITHOTOMY PERCUTANEOUS;  Surgeon: Claybon Jabs, MD;  Location: [facility];  Service: Urology;  Laterality: Left;  left ureteral stent    Social History   Socioeconomic History   Marital status: Married    Spouse name: Not on file   Number of children: 1   Years of education: Not on file   Highest education level: Not on file  Occupational History   Occupation: sales  Tobacco Use   Smoking status: Never   Smokeless tobacco: Never  Vaping Use   Vaping Use: Never used  Substance and Sexual Activity   Alcohol use: Yes    Alcohol/week: 1.0 - 2.0 standard drink of  alcohol    Types: 1 - 2 Glasses of wine per week    Comment: occassional beer, liquor   Drug use: No   Sexual activity: Yes    Partners: Female    Birth control/protection: Abstinence  Other Topics Concern   Not on file  Social History Narrative   Not on file   Social Determinants of Health   Financial Resource Strain: Not on file  Food Insecurity: Not on file  Transportation Needs: Not on file  Physical Activity: Not on file  Stress: Not on file  Social Connections: Not on file  Intimate Partner Violence: Not on file    Family History  Problem Relation Age of Onset   Hip fracture Mother    Cancer Father        melanoma   Heart disease Father        heart valve replacement   Colon cancer Neg Hx    Esophageal cancer Neg Hx    Rectal cancer Neg Hx    Stomach cancer Neg Hx    Colon polyps Neg Hx      Review of Systems  Constitutional: Negative.  Negative for chills and fever.  HENT: Negative.  Negative for congestion and sore throat.   Respiratory: Negative.  Negative for  cough and shortness of breath.   Cardiovascular: Negative.  Negative for chest pain and palpitations.  Gastrointestinal:  Negative for abdominal pain, diarrhea, nausea and vomiting.  Genitourinary: Negative.  Negative for dysuria and hematuria.  Skin: Negative.  Negative for rash.  Neurological: Negative.  Negative for dizziness and headaches.  All other systems reviewed and are negative.   Today's Vitals   12/31/22 1404  BP: 110/66  Pulse: 64  Temp: 97.9 F (36.6 C)  TempSrc: Oral  SpO2: 98%  Weight: 186 lb 8 oz (84.6 kg)  Height: '5\' 10"'$  (1.778 m)   Body mass index is 26.76 kg/m.  Physical Exam Vitals reviewed.  Constitutional:      Appearance: Normal appearance.  HENT:     Head: Normocephalic.     Right Ear: Tympanic membrane, ear canal and external ear normal.     Left Ear: Tympanic membrane, ear canal and external ear normal.     Mouth/Throat:     Mouth: Mucous membranes are  moist.     Pharynx: Oropharynx is clear.  Eyes:     Extraocular Movements: Extraocular movements intact.     Conjunctiva/sclera: Conjunctivae normal.     Pupils: Pupils are equal, round, and reactive to light.  Cardiovascular:     Rate and Rhythm: Normal rate and regular rhythm.     Pulses: Normal pulses.     Heart sounds: Normal heart sounds.  Pulmonary:     Effort: Pulmonary effort is normal.     Breath sounds: Normal breath sounds.  Abdominal:     Palpations: Abdomen is soft.     Tenderness: There is no abdominal tenderness.  Musculoskeletal:     Cervical back: No tenderness.     Right lower leg: No edema.     Left lower leg: No edema.  Lymphadenopathy:     Cervical: No cervical adenopathy.  Skin:    General: Skin is warm and dry.     Capillary Refill: Capillary refill takes less than 2 seconds.  Neurological:     General: No focal deficit present.     Mental Status: He is alert and oriented to person, place, and time.  Psychiatric:        Mood and Affect: Mood normal.        Behavior: Behavior normal.      ASSESSMENT & PLAN: Problem List Items Addressed This Visit   None Visit Diagnoses     Routine general medical examination at a health care facility    -  Primary   Relevant Orders   CBC with Differential   Comprehensive metabolic panel   Hemoglobin A1c   Lipid panel   PSA(Must document that pt has been informed of limitations of PSA testing.)   Prostate cancer screening       Relevant Orders   PSA(Must document that pt has been informed of limitations of PSA testing.)   Screening for deficiency anemia       Relevant Orders   CBC with Differential   Screening for lipoid disorders       Relevant Orders   Lipid panel   Screening for endocrine, metabolic and immunity disorder       Relevant Orders   Comprehensive metabolic panel   Hemoglobin A1c      Modifiable risk factors discussed with patient. Anticipatory guidance according to age provided. The  following topics were also discussed: Social Determinants of Health Smoking.  Non-smoker Diet and nutrition.  Good eating habits Benefits of  exercise.  Exercises regularly Cancer screening and review of colonoscopy report from 2021 which showed multiple polyps and internal hemorrhoids. Vaccinations reviewed and recommendations Cardiovascular risk assessment The 10-year ASCVD risk score (Arnett DK, et al., 2019) is: 4.7%   Values used to calculate the score:     Age: 69 years     Sex: Male     Is Non-Hispanic African American: No     Diabetic: No     Tobacco smoker: No     Systolic Blood Pressure: 678 mmHg     Is BP treated: No     HDL Cholesterol: 69.9 mg/dL     Total Cholesterol: 302 mg/dL Review of all medications Mental health including depression and anxiety Fall and accident prevention  Patient Instructions  Health Maintenance, Male Adopting a healthy lifestyle and getting preventive care are important in promoting health and wellness. Ask your health care provider about: The right schedule for you to have regular tests and exams. Things you can do on your own to prevent diseases and keep yourself healthy. What should I know about diet, weight, and exercise? Eat a healthy diet  Eat a diet that includes plenty of vegetables, fruits, low-fat dairy products, and lean protein. Do not eat a lot of foods that are high in solid fats, added sugars, or sodium. Maintain a healthy weight Body mass index (BMI) is a measurement that can be used to identify possible weight problems. It estimates body fat based on height and weight. Your health care provider can help determine your BMI and help you achieve or maintain a healthy weight. Get regular exercise Get regular exercise. This is one of the most important things you can do for your health. Most adults should: Exercise for at least 150 minutes each week. The exercise should increase your heart rate and make you sweat  (moderate-intensity exercise). Do strengthening exercises at least twice a week. This is in addition to the moderate-intensity exercise. Spend less time sitting. Even light physical activity can be beneficial. Watch cholesterol and blood lipids Have your blood tested for lipids and cholesterol at 55 years of age, then have this test every 5 years. You may need to have your cholesterol levels checked more often if: Your lipid or cholesterol levels are high. You are older than 55 years of age. You are at high risk for heart disease. What should I know about cancer screening? Many types of cancers can be detected early and may often be prevented. Depending on your health history and family history, you may need to have cancer screening at various ages. This may include screening for: Colorectal cancer. Prostate cancer. Skin cancer. Lung cancer. What should I know about heart disease, diabetes, and high blood pressure? Blood pressure and heart disease High blood pressure causes heart disease and increases the risk of stroke. This is more likely to develop in people who have high blood pressure readings or are overweight. Talk with your health care provider about your target blood pressure readings. Have your blood pressure checked: Every 3-5 years if you are 67-80 years of age. Every year if you are 20 years old or older. If you are between the ages of 26 and 39 and are a current or former smoker, ask your health care provider if you should have a one-time screening for abdominal aortic aneurysm (AAA). Diabetes Have regular diabetes screenings. This checks your fasting blood sugar level. Have the screening done: Once every three years after age 105 if you  are at a normal weight and have a low risk for diabetes. More often and at a younger age if you are overweight or have a high risk for diabetes. What should I know about preventing infection? Hepatitis B If you have a higher risk for  hepatitis B, you should be screened for this virus. Talk with your health care provider to find out if you are at risk for hepatitis B infection. Hepatitis C Blood testing is recommended for: Everyone born from [birth year] through 1963. Anyone with known risk factors for hepatitis C. Sexually transmitted infections (STIs) You should be screened each year for STIs, including gonorrhea and chlamydia, if: You are sexually active and are younger than 55 years of age. You are older than 55 years of age and your health care provider tells you that you are at risk for this type of infection. Your sexual activity has changed since you were last screened, and you are at increased risk for chlamydia or gonorrhea. Ask your health care provider if you are at risk. Ask your health care provider about whether you are at high risk for HIV. Your health care provider may recommend a prescription medicine to help prevent HIV infection. If you choose to take medicine to prevent HIV, you should first get tested for HIV. You should then be tested every 3 months for as long as you are taking the medicine. Follow these instructions at home: Alcohol use Do not drink alcohol if your health care provider tells you not to drink. If you drink alcohol: Limit how much you have to 0-2 drinks a day. Know how much alcohol is in your drink. In the [place] one drink equals one 12 oz bottle of beer (355 mL), one 5 oz glass of wine (148 mL), or one 1 oz glass of hard liquor (44 mL). Lifestyle Do not use any products that contain nicotine or tobacco. These products include cigarettes, chewing tobacco, and vaping devices, such as e-cigarettes. If you need help quitting, ask your health care provider. Do not use street drugs. Do not share needles. Ask your health care provider for help if you need support or information about quitting drugs. General instructions Schedule regular health, dental, and eye exams. Stay current with your  vaccines. Tell your health care provider if: You often feel depressed. You have ever been abused or do not feel safe at home. Summary Adopting a healthy lifestyle and getting preventive care are important in promoting health and wellness. Follow your health care provider's instructions about healthy diet, exercising, and getting tested or screened for diseases. Follow your health care provider's instructions on monitoring your cholesterol and blood pressure. This information is not intended to replace advice given to you by your health care provider. Make sure you discuss any questions you have with your health care provider. Document Revised: 04/01/2021 Document Reviewed: 04/01/2021 Elsevier Patient Education  [place], MD [facility]

## 2023-02-24 ENCOUNTER — Encounter: Payer: Self-pay | Admitting: Emergency Medicine

## 2023-02-24 ENCOUNTER — Ambulatory Visit: Payer: Managed Care, Other (non HMO) | Admitting: Emergency Medicine

## 2023-02-24 VITALS — BP 122/74 | HR 76 | Temp 98.5°F | Ht 70.0 in | Wt 184.1 lb

## 2023-02-24 DIAGNOSIS — R1032 Left lower quadrant pain: Secondary | ICD-10-CM | POA: Diagnosis not present

## 2023-02-24 LAB — CBC WITH DIFFERENTIAL/PLATELET
Basophils Absolute: 0.1 10*3/uL (ref 0.0–0.1)
Basophils Relative: 1.4 % (ref 0.0–3.0)
Eosinophils Absolute: 0.1 10*3/uL (ref 0.0–0.7)
Eosinophils Relative: 1 % (ref 0.0–5.0)
HCT: 44.1 % (ref 39.0–52.0)
Hemoglobin: 15.3 g/dL (ref 13.0–17.0)
Lymphocytes Relative: 29.7 % (ref 12.0–46.0)
Lymphs Abs: 1.8 10*3/uL (ref 0.7–4.0)
MCHC: 34.7 g/dL (ref 30.0–36.0)
MCV: 90.2 fl (ref 78.0–100.0)
Monocytes Absolute: 1 10*3/uL (ref 0.1–1.0)
Monocytes Relative: 16.8 % — ABNORMAL HIGH (ref 3.0–12.0)
Neutro Abs: 3.1 10*3/uL (ref 1.4–7.7)
Neutrophils Relative %: 51.1 % (ref 43.0–77.0)
Platelets: 321 10*3/uL (ref 150.0–400.0)
RBC: 4.89 Mil/uL (ref 4.22–5.81)
RDW: 13.1 % (ref 11.5–15.5)
WBC: 6 10*3/uL (ref 4.0–10.5)

## 2023-02-24 LAB — COMPREHENSIVE METABOLIC PANEL
ALT: 18 U/L (ref 0–53)
AST: 23 U/L (ref 0–37)
Albumin: 4.6 g/dL (ref 3.5–5.2)
Alkaline Phosphatase: 66 U/L (ref 39–117)
BUN: 13 mg/dL (ref 6–23)
CO2: 28 mEq/L (ref 19–32)
Calcium: 9.2 mg/dL (ref 8.4–10.5)
Chloride: 99 mEq/L (ref 96–112)
Creatinine, Ser: 1.06 mg/dL (ref 0.40–1.50)
GFR: 79.62 mL/min (ref >60.00)
Glucose, Bld: 88 mg/dL (ref 70–99)
Potassium: 4.4 mEq/L (ref 3.5–5.1)
Sodium: 133 mEq/L — ABNORMAL LOW (ref 135–145)
Total Bilirubin: 0.5 mg/dL (ref 0.2–1.2)
Total Protein: 8 g/dL (ref 6.0–8.3)

## 2023-02-24 LAB — URINALYSIS, ROUTINE W REFLEX MICROSCOPIC
Bilirubin Urine: NEGATIVE
Ketones, ur: NEGATIVE
Leukocytes,Ua: NEGATIVE
Nitrite: NEGATIVE
Specific Gravity, Urine: 1.01 (ref 1.000–1.030)
Total Protein, Urine: NEGATIVE
Urine Glucose: NEGATIVE
Urobilinogen, UA: 0.2 (ref 0.0–1.0)
WBC, UA: NONE SEEN (ref 0–?)
pH: 6 (ref 5.0–8.0)

## 2023-02-24 NOTE — Patient Instructions (Signed)
Abdominal Pain, Adult Many things can cause belly (abdominal) pain. Most times, belly pain is not dangerous. Many cases of belly pain can be watched and treated at home. Sometimes, though, belly pain is serious. Your doctor will try to find the cause of your belly pain. Follow these instructions at home:  Medicines Take over-the-counter and prescription medicines only as told by your doctor. Do not take medicines that help you poop (laxatives) unless told by your doctor. General instructions Watch your belly pain for any changes. Drink enough fluid to keep your pee (urine) pale yellow. Keep all follow-up visits as told by your doctor. This is important. Contact a doctor if: Your belly pain changes or gets worse. You are not hungry, or you lose weight without trying. You are having trouble pooping (constipated) or have watery poop (diarrhea) for more than 2-3 days. You have pain when you pee or poop. Your belly pain wakes you up at night. Your pain gets worse with meals, after eating, or with certain foods. You are vomiting and cannot keep anything down. You have a fever. You have blood in your pee. Get help right away if: Your pain does not go away as soon as your doctor says it should. You cannot stop vomiting. Your pain is only in areas of your belly, such as the right side or the left lower part of the belly. You have bloody or black poop, or poop that looks like tar. You have very bad pain, cramping, or bloating in your belly. You have signs of not having enough fluid or water in your body (dehydration), such as: Dark pee, very little pee, or no pee. Cracked lips. Dry mouth. Sunken eyes. Sleepiness. Weakness. You have trouble breathing or chest pain. Summary Many cases of belly pain can be watched and treated at home. Watch your belly pain for any changes. Take over-the-counter and prescription medicines only as told by your doctor. Contact a doctor if your belly pain  changes or gets worse. Get help right away if you have very bad pain, cramping, or bloating in your belly. This information is not intended to replace advice given to you by your health care provider. Make sure you discuss any questions you have with your health care provider. Document Revised: 03/21/2019 Document Reviewed: 03/21/2019 Elsevier Patient Education  2023 Elsevier Inc.  

## 2023-02-24 NOTE — Progress Notes (Signed)
Victor Nixon 57 y.o.   Chief Complaint  Patient presents with   Bloated    Abd bloating and discomfort x 1 week or so  Sharp pain in groin area     HISTORY OF PRESENT ILLNESS: This is a 57 y.o. male complaining of intermittent bloating and discomfort to left lower abdominal area that started about 5 to 6 weeks ago. No associated symptoms.  Did have episode of constipation which responded to MiraLAX.  Able to move bowels.  No urinary symptoms.  Has history of kidney stones. Recently returned from 1 week trip to [place].  Felt a little bit under the weather last couple days with achiness, low-grade fever, and mild sore throat. Discomfort much improved today.  Discomfort did not interfere with physical activities.  Has not felt a lump in the area. Colonoscopy report from 2021 showed multiple polyps and internal hemorrhoids. No other complaints or medical concerns today.  HPI   Prior to Admission medications   Medication Sig Start Date End Date Taking? Authorizing Provider  Omega-3 Fatty Acids (FISH OIL) 1200 MG CAPS Take 2,400 mg by mouth daily.   Yes [provider]  rosuvastatin (CRESTOR) 10 MG tablet Take 1 tablet (10 mg total) by mouth daily. 06/27/22  Yes Horald Pollen, MD    Allergies  Allergen Reactions   Cephalexin Rash    Patient Active Problem List   Diagnosis Date Noted   HLD (hyperlipidemia) 04/24/2022   Family history of melanoma 02/18/2016    Past Medical History:  Diagnosis Date   Allergy    seasonal allergies   Broken arm (left arm) childhood   Heart murmur    Stable - not audible 5 yrs ago   History of kidney stones    Hyperlipidemia     Past Surgical History:  Procedure Laterality Date   HERNIA REPAIR     age 19 or 60   INGUINAL HERNIA REPAIR Right 06/11/2022   Procedure: LAPAROSCOPIC RIGHT INGUINAL HERNIA;  Surgeon: Coralie Keens, MD;  Location: [facility];  Service: General;  Laterality: Right;   NEPHROLITHOTOMY  06/21/2012    Procedure: NEPHROLITHOTOMY PERCUTANEOUS;  Surgeon: Claybon Jabs, MD;  Location: [facility];  Service: Urology;  Laterality: Left;  left ureteral stent    Social History   Socioeconomic History   Marital status: Married    Spouse name: Not on file   Number of children: 1   Years of education: Not on file   Highest education level: Not on file  Occupational History   Occupation: sales  Tobacco Use   Smoking status: Never   Smokeless tobacco: Never  Vaping Use   Vaping Use: Never used  Substance and Sexual Activity   Alcohol use: Yes    Alcohol/week: 1.0 - 2.0 standard drink of alcohol    Types: 1 - 2 Glasses of wine per week    Comment: occassional beer, liquor   Drug use: No   Sexual activity: Yes    Partners: Female    Birth control/protection: Abstinence  Other Topics Concern   Not on file  Social History Narrative   Not on file   Social Determinants of Health   Financial Resource Strain: Not on file  Food Insecurity: Not on file  Transportation Needs: Not on file  Physical Activity: Not on file  Stress: Not on file  Social Connections: Not on file  Intimate Partner Violence: Not on file    Family History  Problem Relation Age of  Onset   Hip fracture Mother    Cancer Father        melanoma   Heart disease Father        heart valve replacement   Colon cancer Neg Hx    Esophageal cancer Neg Hx    Rectal cancer Neg Hx    Stomach cancer Neg Hx    Colon polyps Neg Hx      Review of Systems  Constitutional: Negative.  Negative for chills and fever.  HENT: Negative.  Negative for congestion and sore throat.   Respiratory: Negative.  Negative for cough and shortness of breath.   Cardiovascular:  Negative for chest pain and palpitations.  Gastrointestinal:  Negative for abdominal pain, blood in stool, diarrhea, melena, nausea and vomiting.  Genitourinary: Negative.  Negative for dysuria and hematuria.  Skin: Negative.  Negative for rash.  Neurological:  Negative.  Negative for dizziness and headaches.  All other systems reviewed and are negative.   Vitals:   02/24/23 1333  BP: 122/74  Pulse: 76  Temp: 98.5 F (36.9 C)  SpO2: 95%    Physical Exam Vitals reviewed.  Constitutional:      Appearance: Normal appearance.  HENT:     Head: Normocephalic.     Right Ear: Tympanic membrane, ear canal and external ear normal.     Left Ear: Tympanic membrane, ear canal and external ear normal.     Mouth/Throat:     Mouth: Mucous membranes are moist.     Pharynx: Oropharynx is clear.  Eyes:     Extraocular Movements: Extraocular movements intact.     Conjunctiva/sclera: Conjunctivae normal.     Pupils: Pupils are equal, round, and reactive to light.  Cardiovascular:     Rate and Rhythm: Normal rate and regular rhythm.     Pulses: Normal pulses.     Heart sounds: Normal heart sounds.  Pulmonary:     Effort: Pulmonary effort is normal.     Breath sounds: Normal breath sounds.  Abdominal:     General: Bowel sounds are normal. There is no distension.     Palpations: Abdomen is soft. There is no mass.     Tenderness: There is no abdominal tenderness. There is no right CVA tenderness, left CVA tenderness or rebound.     Hernia: A hernia is present. There is no hernia in the left inguinal area or right inguinal area.  Genitourinary:    Testes: Normal.  Musculoskeletal:     Cervical back: No tenderness.  Lymphadenopathy:     Cervical: No cervical adenopathy.     Lower Body: No right inguinal adenopathy. No left inguinal adenopathy.  Neurological:     General: No focal deficit present.     Mental Status: He is alert and oriented to person, place, and time.  Psychiatric:        Mood and Affect: Mood normal.        Behavior: Behavior normal.      ASSESSMENT & PLAN: A total of 44 minutes was spent with the patient and counseling/coordination of care regarding preparing for this visit, review of most recent office visit notes,  differential diagnosis of left lower abdominal discomfort and need for workup including blood and CT scan of abdomen and pelvis, education on nutrition, pain management, prognosis, documentation, need for follow-up.  Problem List Items Addressed This Visit       Other   Abdominal discomfort in left lower quadrant - Primary    Clinical stable.  No red flag signs or symptoms Asymptomatic today. Benign abdominal examination. Able to eat and drink.  No nausea or vomiting.  Moving bowels okay. Differential diagnosis discussed. Recommend blood work today Recommend CT scan of abdomen and pelvis Diet and nutrition discussed We will follow-up after CT scan      Relevant Orders   CBC with Differential/Platelet   Comprehensive metabolic panel   Urinalysis   CT ABDOMEN PELVIS W WO CONTRAST   Other Visit Diagnoses     Left lower quadrant abdominal pain          Patient Instructions  Abdominal Pain, Adult Many things can cause belly (abdominal) pain. Most times, belly pain is not dangerous. Many cases of belly pain can be watched and treated at home. Sometimes, though, belly pain is serious. Your doctor will try to find the cause of your belly pain. Follow these instructions at home:  Medicines Take over-the-counter and prescription medicines only as told by your doctor. Do not take medicines that help you poop (laxatives) unless told by your doctor. General instructions Watch your belly pain for any changes. Drink enough fluid to keep your pee (urine) pale yellow. Keep all follow-up visits as told by your doctor. This is important. Contact a doctor if: Your belly pain changes or gets worse. You are not hungry, or you lose weight without trying. You are having trouble pooping (constipated) or have watery poop (diarrhea) for more than 2-3 days. You have pain when you pee or poop. Your belly pain wakes you up at night. Your pain gets worse with meals, after eating, or with certain  foods. You are vomiting and cannot keep anything down. You have a fever. You have blood in your pee. Get help right away if: Your pain does not go away as soon as your doctor says it should. You cannot stop vomiting. Your pain is only in areas of your belly, such as the right side or the left lower part of the belly. You have bloody or black poop, or poop that looks like tar. You have very bad pain, cramping, or bloating in your belly. You have signs of not having enough fluid or water in your body (dehydration), such as: Dark pee, very little pee, or no pee. Cracked lips. Dry mouth. Sunken eyes. Sleepiness. Weakness. You have trouble breathing or chest pain. Summary Many cases of belly pain can be watched and treated at home. Watch your belly pain for any changes. Take over-the-counter and prescription medicines only as told by your doctor. Contact a doctor if your belly pain changes or gets worse. Get help right away if you have very bad pain, cramping, or bloating in your belly. This information is not intended to replace advice given to you by your health care provider. Make sure you discuss any questions you have with your health care provider. Document Revised: 03/21/2019 Document Reviewed: 03/21/2019 Elsevier Patient Education  [place], MD [facility]

## 2023-02-24 NOTE — Assessment & Plan Note (Signed)
Clinical stable.  No red flag signs or symptoms Asymptomatic today. Benign abdominal examination. Able to eat and drink.  No nausea or vomiting.  Moving bowels okay. Differential diagnosis discussed. Recommend blood work today Recommend CT scan of abdomen and pelvis Diet and nutrition discussed We will follow-up after CT scan

## 2023-02-26 ENCOUNTER — Telehealth: Payer: Self-pay | Admitting: Emergency Medicine

## 2023-02-26 NOTE — Telephone Encounter (Signed)
Patient called and said he would like a call back from a nurse. He said he had questions about the CT needed and 'other stuff' that he did not want to elaborate on. Best call back is [phone].

## 2023-02-26 NOTE — Telephone Encounter (Signed)
Called patient and informed patient that [facility] attempted to contact patient to schedule, gave patient the office contact information to call and schedule

## 2023-03-04 NOTE — Telephone Encounter (Signed)
Pt call stating that his insurance keep denying his CT scan. Pt have appt tomorrow and wanted to know if there anything his Dr. Evaristo Bury do or is there additional info they need so he would be able to get his CT scan. I advised pt to call his insurance as well.

## 2023-03-05 ENCOUNTER — Other Ambulatory Visit: Payer: Managed Care, Other (non HMO)

## 2023-03-10 ENCOUNTER — Telehealth: Payer: Self-pay | Admitting: *Deleted

## 2023-03-10 NOTE — Telephone Encounter (Signed)
Recommend GI consult instead then.  Thanks.

## 2023-03-10 NOTE — Telephone Encounter (Signed)
Patient CT scan was denied, patient is aware, please advise on any other scans that needs to be order for this patient

## 2023-03-10 NOTE — Telephone Encounter (Signed)
Called patient and informed him of provider recommendation. Patient will call the office back on if he wants to so with this plan of care

## 2023-04-14 ENCOUNTER — Other Ambulatory Visit: Payer: Self-pay | Admitting: *Deleted

## 2023-04-14 ENCOUNTER — Encounter: Payer: Self-pay | Admitting: Physician Assistant

## 2023-04-14 DIAGNOSIS — Z0000 Encounter for general adult medical examination without abnormal findings: Secondary | ICD-10-CM

## 2023-04-14 NOTE — Telephone Encounter (Signed)
Referred patient to another GI provider.

## 2023-04-14 NOTE — Telephone Encounter (Signed)
Patient said he would like to move forward with a GI referral. He said [facility] is booked out until July and he was hoping a referral could be sent somewhere else with sooner availability. The patient would like a call back from [facility] to discuss. Best callback is [phone].

## 2023-06-08 ENCOUNTER — Ambulatory Visit: Payer: Managed Care, Other (non HMO) | Admitting: Physician Assistant

## 2023-07-03 ENCOUNTER — Other Ambulatory Visit: Payer: Self-pay | Admitting: Emergency Medicine

## 2023-07-03 DIAGNOSIS — E785 Hyperlipidemia, unspecified: Secondary | ICD-10-CM

## 2023-08-28 ENCOUNTER — Ambulatory Visit: Payer: Managed Care, Other (non HMO) | Admitting: Physician Assistant

## 2023-08-28 ENCOUNTER — Encounter: Payer: Self-pay | Admitting: Physician Assistant

## 2023-08-28 DIAGNOSIS — R110 Nausea: Secondary | ICD-10-CM

## 2023-08-28 DIAGNOSIS — R1012 Left upper quadrant pain: Secondary | ICD-10-CM

## 2023-08-28 DIAGNOSIS — R140 Abdominal distension (gaseous): Secondary | ICD-10-CM

## 2023-08-28 DIAGNOSIS — K625 Hemorrhage of anus and rectum: Secondary | ICD-10-CM

## 2023-08-28 DIAGNOSIS — Z860100 Personal history of colon polyps, unspecified: Secondary | ICD-10-CM

## 2023-08-28 MED ORDER — SUTAB 1479-225-188 MG PO TABS: ORAL_TABLET | ORAL | 0 refills | Status: DC

## 2023-08-28 NOTE — Progress Notes (Signed)
Chief Complaint: Rectal Bleeding and Abdominal Pain  HPI:    Victor Nixon is a 57 year old Caucasian male, known to Dr. Rhea Belton, with a past medical history as listed below including a heart murmur, who was referred to me by Georgina Quint, * for a complaint of rectal bleeding and abdominal pain.      05/03/2020 colonoscopy for screening with 2 to 3-5 mm polyps in the cecum, one 7 mm polyp hepatic flexure, 15 mm polyp hepatic flexure, one 3 mm polyp in descending colon small internal hemorrhoids.  Pathology showed mixture of sessile serrated and hyperplastic polyps.  Repeat recommended in 3 years.    Today, patient presents to clinic and has kept a log on his phone of his symptoms.  He tells me that about 5 to 6 months ago he started with symptoms but they have not been consistent or constant since that time, just off and on.  Initially, this was a left upper quadrant pain that seems to radiate into his left lower quadrant that was described as a "tugging/bloating", that radiated into his back.  If he did not get good sleep or ate poorly then this would worsen to almost a pain.  He went and saw his PCP who recommended a CT but his insurance would not pay for it so it was recommended he follow-up with [place], apparently could not get in for a while and the pain seem to dissipate, but then come back.  Over that time, also noticed occasional bright red blood in his stool maybe a couple of months ago for the first time typically with a bowel movement, has continued to have issues with what he thinks is an internal hemorrhoid that he has to push back in sometimes after a bowel movement and blames this for his bleeding.  He has changed his diet to a more clean diet and feels like it has helped a little bit but most concerning to him is this ongoing left upper quadrant pain and bloating.  Rates this as a 2-3/10 almost constantly now.  Does tell me he has good bowel movements, he is regular and feels like he  empties out.  Does have some early satiety though.    Denies fever, chills or weight loss.  Past Medical History:  Diagnosis Date   Allergy    seasonal allergies   Broken arm (left arm) childhood   Heart murmur    Stable - not audible 5 yrs ago   History of kidney stones    Hyperlipidemia     Past Surgical History:  Procedure Laterality Date   HERNIA REPAIR     age 52 or 20   INGUINAL HERNIA REPAIR Right 06/11/2022   Procedure: LAPAROSCOPIC RIGHT INGUINAL HERNIA;  Surgeon: Abigail Miyamoto, MD;  Location: [facility];  Service: General;  Laterality: Right;   NEPHROLITHOTOMY  06/21/2012   Procedure: NEPHROLITHOTOMY PERCUTANEOUS;  Surgeon: [name], MD;  Location: [facility];  Service: Urology;  Laterality: Left;  left ureteral stent    Current Outpatient Medications  Medication Sig Dispense Refill   Multiple Vitamin (MULTIVITAMIN) capsule Take 1 capsule by mouth daily.     Omega-3 Fatty Acids (FISH OIL) 1200 MG CAPS Take 2,400 mg by mouth daily.     rosuvastatin (CRESTOR) 10 MG tablet TAKE 1 TABLET DAILY 90 tablet 3   No current facility-administered medications for this visit.    Allergies as of 08/28/2023 - Review Complete 08/28/2023  Allergen Reaction Noted  Cephalexin Rash 01/31/2012    Family History  Problem Relation Age of Onset   Hip fracture Mother    Cancer Father        melanoma   Heart disease Father        heart valve replacement   Colon cancer Neg Hx    Esophageal cancer Neg Hx    Rectal cancer Neg Hx    Stomach cancer Neg Hx    Colon polyps Neg Hx     Social History   Socioeconomic History   Marital status: Married    Spouse name: Not on file   Number of children: 1   Years of education: Not on file   Highest education level: Not on file  Occupational History   Occupation: sales  Tobacco Use   Smoking status: Never   Smokeless tobacco: Never  Vaping Use   Vaping status: Never Used  Substance and Sexual Activity   Alcohol use: Yes     Alcohol/week: 1.0 - 2.0 standard drink of alcohol    Types: 1 - 2 Glasses of wine per week    Comment: occassional beer, liquor   Drug use: No   Sexual activity: Yes    Partners: Female    Birth control/protection: Abstinence  Other Topics Concern   Not on file  Social History Narrative   Not on file   Social Determinants of Health   Financial Resource Strain: Not on file  Food Insecurity: Not on file  Transportation Needs: Not on file  Physical Activity: Not on file  Stress: Not on file  Social Connections: Not on file  Intimate Partner Violence: Not on file    Review of Systems:    Constitutional: No weight loss, fever or chills Skin: No rash Cardiovascular: No chest pain Respiratory: No SOB  Gastrointestinal: See HPI and otherwise negative Genitourinary: No dysuria  Neurological: No headache, dizziness or syncope Musculoskeletal: No new muscle or joint pain Hematologic: No bruising Psychiatric: No history of depression or anxiety   Physical Exam:  Vital signs: BP 110/74 (BP Location: Left Arm, Patient Position: Sitting, Cuff Size: Normal)   Pulse 78   Ht 5' 9.5" (1.765 m)   Wt 183 lb (83 kg)   BMI 26.64 kg/m    Constitutional:   Pleasant Caucasian male appears to be in NAD, Well developed, Well nourished, alert and cooperative Head:  Normocephalic and atraumatic. Eyes:   PEERL, EOMI. No icterus. Conjunctiva pink. Ears:  Normal auditory acuity. Neck:  Supple Throat: Oral cavity and pharynx without inflammation, swelling or lesion.  Respiratory: Respirations even and unlabored. Lungs clear to auscultation bilaterally.   No wheezes, crackles, or rhonchi.  Cardiovascular: Normal S1, S2. No MRG. Regular rate and rhythm. No peripheral edema, cyanosis or pallor.  Gastrointestinal:  Soft, nondistended, mild left upper quadrant TTP no rebound or guarding. Normal bowel sounds. No appreciable masses or hepatomegaly. Rectal: Declined Msk:  Symmetrical without gross  deformities. Without edema, no deformity or joint abnormality.  Neurologic:  Alert and  oriented x4;  grossly normal neurologically.  Skin:   Dry and intact without significant lesions or rashes. Psychiatric: Demonstrates good judgement and reason without abnormal affect or behaviors.  RELEVANT LABS AND IMAGING: CBC    Component Value Date/Time   WBC 6.0 02/24/2023 1413   RBC 4.89 02/24/2023 1413   HGB 15.3 02/24/2023 1413   HGB 14.7 04/10/2017 1019   HCT 44.1 02/24/2023 1413   HCT 44.0 04/10/2017 1019   PLT 321.0  02/24/2023 1413   PLT 390 (H) 04/10/2017 1019   MCV 90.2 02/24/2023 1413   MCV 89 04/10/2017 1019   MCH 30.4 06/06/2022 1512   MCHC 34.7 02/24/2023 1413   RDW 13.1 02/24/2023 1413   RDW 13.6 04/10/2017 1019   LYMPHSABS 1.8 02/24/2023 1413   LYMPHSABS 1.7 04/10/2017 1019   MONOABS 1.0 02/24/2023 1413   EOSABS 0.1 02/24/2023 1413   EOSABS 0.2 04/10/2017 1019   BASOSABS 0.1 02/24/2023 1413   BASOSABS 0.0 04/10/2017 1019    CMP     Component Value Date/Time   NA 133 (L) 02/24/2023 1413   NA 139 04/10/2017 1019   K 4.4 02/24/2023 1413   CL 99 02/24/2023 1413   CO2 28 02/24/2023 1413   GLUCOSE 88 02/24/2023 1413   BUN 13 02/24/2023 1413   BUN 16 04/10/2017 1019   CREATININE 1.06 02/24/2023 1413   CREATININE 0.88 01/10/2019 1150   CALCIUM 9.2 02/24/2023 1413   PROT 8.0 02/24/2023 1413   PROT 7.5 04/10/2017 1019   ALBUMIN 4.6 02/24/2023 1413   ALBUMIN 4.8 04/10/2017 1019   AST 23 02/24/2023 1413   ALT 18 02/24/2023 1413   ALKPHOS 66 02/24/2023 1413   BILITOT 0.5 02/24/2023 1413   BILITOT 0.6 04/10/2017 1019   GFRNONAA 98 04/10/2017 1019   GFRAA 113 04/10/2017 1019    Assessment: 1.  Left upper quadrant abdominal pain: With bloating gas and nausea, seems to come off and on; consider splenic flexure syndrome versus other  2.  Nausea: With above; consider epigastric etiology versus relation to discomfort/bloating 3.  Rectal bleeding: Findings of internal  hemorrhoids on last colonoscopy, offered banding at that time but declined, now tells me he has to "push back in" a hemorrhoid and does see some rectal bleeding; likely prolapsed internal hemorrhoid, declined rectal exam today 4.  History of adenomatous polyps: On last colonoscopy in 2021 with repeat recommended in 3 years, patient is 4 months overdue  Plan: 1.  Scheduled patient for diagnostic EGD and surveillance colonoscopy in the [facility] with Dr. Rhea Belton.  Did provide the patient a detailed list of risks for the procedures and he agrees to proceed. Patient is appropriate for endoscopic procedure(s) in the ambulatory ([facility]) setting.  2.  Patient requested Sutab's for his prep 3.  Briefly discussed hemorrhoid banding, apparently this was offered to him after last colonoscopy, he would like to pursue this if able.  Explained that he and Dr. Rhea Belton can discuss after time of colonoscopy. 4.  Patient will continue his improved diet and water intake 5.  Patient to follow in clinic per recommendations after time of procedures.  Victor Meeker, PA-C [facility] 08/28/2023, 9:42 AM  Cc: Georgina Quint, *

## 2023-08-28 NOTE — Patient Instructions (Signed)
_______________________________________________________  If your blood pressure at your visit was 140/90 or greater, please contact your primary care physician to follow up on this.  _______________________________________________________  If you are age 55 or older, your body mass index should be between 23-30. Your Body mass index is 26.64 kg/m. If this is out of the aforementioned range listed, please consider follow up with your Primary Care Provider.  If you are age 53 or younger, your body mass index should be between 19-25. Your Body mass index is 26.64 kg/m. If this is out of the aformentioned range listed, please consider follow up with your Primary Care Provider.   ________________________________________________________  The [facility] providers would like to encourage you to use [facility] to communicate with providers for non-urgent requests or questions.  Due to long hold times on the telephone, sending your provider a message by [facility] may be a faster and more efficient way to get a response.  Please allow 48 business hours for a response.  Please remember that this is for non-urgent requests.  _______________________________________________________  Bonita Quin have been scheduled for an endoscopy and colonoscopy. Please follow the written instructions given to you at your visit today.  Please pick up your prep supplies at the pharmacy within the next 1-3 days.  If you use inhalers (even only as needed), please bring them with you on the day of your procedure.  DO NOT TAKE 7 DAYS PRIOR TO TEST- Trulicity (dulaglutide) Ozempic, Wegovy (semaglutide) Mounjaro (tirzepatide) Bydureon Bcise (exanatide extended release)  DO NOT TAKE 1 DAY PRIOR TO YOUR TEST Rybelsus (semaglutide) Adlyxin (lixisenatide) Victoza (liraglutide) Byetta (exanatide) ___________________________________________________________________________  Due to recent changes in healthcare laws, you may see the  results of your imaging and laboratory studies on MyChart before your provider has had a chance to review them.  We understand that in some cases there may be results that are confusing or concerning to you. Not all laboratory results come back in the same time frame and the provider may be waiting for multiple results in order to interpret others.  Please give [place] 48 hours in order for your provider to thoroughly review all the results before contacting the office for clarification of your results.   Thank you for entrusting me with your care and choosing [facility].  Hyacinth Meeker, PA-C

## 2023-09-02 NOTE — Progress Notes (Signed)
Addendum: Reviewed and agree with assessment and management plan. Kadijah Shamoon M, MD  

## 2023-10-12 ENCOUNTER — Other Ambulatory Visit: Payer: Self-pay

## 2023-10-12 ENCOUNTER — Telehealth: Payer: Self-pay

## 2023-10-12 MED ORDER — OMEPRAZOLE 20 MG PO CPDR: 20.0000 mg | DELAYED_RELEASE_CAPSULE | Freq: Every day | ORAL | 3 refills | Status: DC

## 2023-10-12 NOTE — Telephone Encounter (Signed)
-----   Message from Unk Lightning sent at 10/12/2023  9:58 AM EST ----- Regarding: RE: EGD DENIED Note, does not meet those criteria.  I guess we will need to start him on Omeprazole 20 mg daily for the next month to see if this helps first.  Thanks, JL L ----- Message ----- From: Beverley Fiedler, MD Sent: 10/12/2023   8:34 AM EST To: Unk Lightning, PA Subject: FW: EGD DENIED                                 EGD you requested was denied Based on your encounter with him does he meet any of these covered criteria below? If not we need to make another plan re: his symptoms JMP ----- Message ----- FromBeverely Pace, April D Sent: 10/12/2023   6:45 AM EST To: Chrystie Nose, RN; Beverley Fiedler, MD Subject: EGD DENIED                                     Good Morning, Received fax from this patient's insurance that his EGD was denied for the following reason:  This decision was based on the following: Your doctor told us that you have upper belly pain. A test to see images from your throat to your stomach was asked for. We cannot approve this request because: An EGD can be done for one of these reasons. -You had a failed four week trial of a daily proton pump inhibitor (drug to decrease stomach acid) or treatment for a stomach infection failed. -You have a parent, full sibling, or child who had cancer in the upper GI (gastrointestinal) tract. The upper GI tract consists of the tube through which food travels from your mouth to your stomach, your stomach, and the first part of your small intestine. -In the past six to 12 months, you have lost more than five percent of your weight without trying. -There is suspected upper GI bleeding based on blood tests, exam, and/or history. -You have a lack of iron and healthy red blood cells that may have started in your upper GI tract. -You have trouble swallowing. -Chest pain with swallowing is present. -You have been vomiting for at least  seven days in a row with no known cause. -You had prior upper GI imaging that suggested there is disease present. -Your doctor suspects cancer based on your history and exam. -Your doctor felt a lump in your belly or swollen glands during an exam. The notes sent to [place] do not describe one of these reasons for testing.  You can call them to set up peer-to-peer at [phone] or you can try to talk with medical management to get the auth over the phone. The Case Number is [id number].  Thank you.

## 2023-10-12 NOTE — Telephone Encounter (Signed)
Left detailed message for pt that EGD is not covered, appt has been cancelled. Pt will keep colon appt. Omeprazole script sent to pharmacy.

## 2023-10-26 ENCOUNTER — Encounter: Payer: Self-pay | Admitting: Internal Medicine

## 2023-10-26 ENCOUNTER — Ambulatory Visit: Payer: Managed Care, Other (non HMO) | Admitting: Internal Medicine

## 2023-10-26 VITALS — BP 110/64 | HR 70 | Temp 97.9°F | Resp 12 | Ht 69.5 in | Wt 183.0 lb

## 2023-10-26 DIAGNOSIS — K648 Other hemorrhoids: Secondary | ICD-10-CM | POA: Diagnosis not present

## 2023-10-26 DIAGNOSIS — K635 Polyp of colon: Secondary | ICD-10-CM

## 2023-10-26 DIAGNOSIS — Z860100 Personal history of colon polyps, unspecified: Secondary | ICD-10-CM

## 2023-10-26 DIAGNOSIS — Z1211 Encounter for screening for malignant neoplasm of colon: Secondary | ICD-10-CM

## 2023-10-26 DIAGNOSIS — R140 Abdominal distension (gaseous): Secondary | ICD-10-CM

## 2023-10-26 DIAGNOSIS — K625 Hemorrhage of anus and rectum: Secondary | ICD-10-CM

## 2023-10-26 DIAGNOSIS — R1012 Left upper quadrant pain: Secondary | ICD-10-CM

## 2023-10-26 DIAGNOSIS — D127 Benign neoplasm of rectosigmoid junction: Secondary | ICD-10-CM

## 2023-10-26 DIAGNOSIS — D122 Benign neoplasm of ascending colon: Secondary | ICD-10-CM | POA: Diagnosis not present

## 2023-10-26 MED ORDER — SODIUM CHLORIDE 0.9 % IV SOLN: 500.0000 mL | Freq: Once | INTRAVENOUS | Status: DC

## 2023-10-26 NOTE — Progress Notes (Unsigned)
Pt's states no medical or surgical changes since previsit or office visit. 

## 2023-10-26 NOTE — Patient Instructions (Signed)
Thank you for letting us care for your healthcare needs today! Please see handouts regarding Hemorrhoids and Polyps.  YOU HAD AN ENDOSCOPIC PROCEDURE TODAY AT THE [facility]:   Refer to the procedure report that was given to you for any specific questions about what was found during the examination.  If the procedure report does not answer your questions, please call your gastroenterologist to clarify.  If you requested that your care partner not be given the details of your procedure findings, then the procedure report has been included in a sealed envelope for you to review at your convenience later.  YOU SHOULD EXPECT: Some feelings of bloating in the abdomen. Passage of more gas than usual.  Walking can help get rid of the air that was put into your GI tract during the procedure and reduce the bloating. If you had a lower endoscopy (such as a colonoscopy or flexible sigmoidoscopy) you may notice spotting of blood in your stool or on the toilet paper. If you underwent a bowel prep for your procedure, you may not have a normal bowel movement for a few days.  Please Note:  You might notice some irritation and congestion in your nose or some drainage.  This is from the oxygen used during your procedure.  There is no need for concern and it should clear up in a day or so.  SYMPTOMS TO REPORT IMMEDIATELY:  Following lower endoscopy (colonoscopy or flexible sigmoidoscopy):  Excessive amounts of blood in the stool  Significant tenderness or worsening of abdominal pains  Swelling of the abdomen that is new, acute  Fever of 100F or higher   For urgent or emergent issues, a gastroenterologist can be reached at any hour by calling [phone]. Do not use MyChart messaging for urgent concerns.    DIET:  We do recommend a small meal at first, but then you may proceed to your regular diet.  Drink plenty of fluids but you should avoid alcoholic beverages for 24 hours.  ACTIVITY:  You  should plan to take it easy for the rest of today and you should NOT DRIVE or use heavy machinery until tomorrow (because of the sedation medicines used during the test).    FOLLOW UP: Our staff will call the number listed on your records the next business day following your procedure.  We will call around 7:15- 8:00 am to check on you and address any questions or concerns that you may have regarding the information given to you following your procedure. If we do not reach you, we will leave a message.     If any biopsies were taken you will be contacted by phone or by letter within the next 1-3 weeks.  Please call us at [phone] if you have not heard about the biopsies in 3 weeks.    SIGNATURES/CONFIDENTIALITY: You and/or your care partner have signed paperwork which will be entered into your electronic medical record.  These signatures attest to the fact that that the information above on your After Visit Summary has been reviewed and is understood.  Full responsibility of the confidentiality of this discharge information lies with you and/or your care-partner.

## 2023-10-26 NOTE — Op Note (Signed)
[facility] Patient Name: Victor Nixon Procedure Date: 10/26/2023 2:52 PM MRN: [id number] Endoscopist: Beverley Fiedler , MD, [id number] Age: 57 Referring MD:  Date of Birth: [birth year] Gender: Male Account #: [id number] Procedure:                Colonoscopy Indications:              High risk colon cancer surveillance: Personal                            history of sessile serrated colon polyps (one 10 mm                            or greater in size), Last colonoscopy: June 2021                            (SSP x 4); recent intermittent rectal bleeding and                            left side bloating abdominal pain Medicines:                Monitored Anesthesia Care Procedure:                Pre-Anesthesia Assessment:                           - Prior to the procedure, a History and Physical                            was performed, and patient medications and                            allergies were reviewed. The patient's tolerance of                            previous anesthesia was also reviewed. The risks                            and benefits of the procedure and the sedation                            options and risks were discussed with the patient.                            All questions were answered, and informed consent                            was obtained. Prior Anticoagulants: The patient has                            taken no anticoagulant or antiplatelet agents. ASA                            Grade Assessment: II - A patient with mild systemic  disease. After reviewing the risks and benefits,                            the patient was deemed in satisfactory condition to                            undergo the procedure.                           After obtaining informed consent, the colonoscope                            was passed under direct vision. Throughout the                            procedure, the patient's blood  pressure, pulse, and                            oxygen saturations were monitored continuously. The                            Olympus Scope SN: [id number] was introduced through                            the anus and advanced to the terminal ileum. The                            colonoscopy was performed without difficulty. The                            patient tolerated the procedure well. The quality                            of the bowel preparation was good. The ileocecal                            valve, appendiceal orifice, and rectum were                            photographed. Scope In: 3:03:24 PM Scope Out: 3:17:58 PM Scope Withdrawal Time: 0 hours 9 minutes 50 seconds  Total Procedure Duration: 0 hours 14 minutes 34 seconds  Findings:                 The digital rectal exam was normal.                           The terminal ileum appeared normal.                           A 2 mm polyp was found in the ascending colon. The                            polyp was sessile. The polyp was removed with a  cold snare. Resection and retrieval were complete.                           A 5 mm polyp was found in the recto-sigmoid colon.                            The polyp was sessile. The polyp was removed with a                            cold snare. Resection and retrieval were complete.                           Internal hemorrhoids were found during                            retroflexion. The hemorrhoids were medium-sized.                           The exam was otherwise without abnormality. Complications:            No immediate complications. Estimated Blood Loss:     Estimated blood loss: none. Impression:               - The examined portion of the ileum was normal.                           - One 2 mm polyp in the ascending colon, removed                            with a cold snare. Resected and retrieved.                           - One 5 mm polyp at  the recto-sigmoid colon,                            removed with a cold snare. Resected and retrieved.                           - Internal hemorrhoids.                           - The examination was otherwise normal. Recommendation:           - Patient has a contact number available for                            emergencies. The signs and symptoms of potential                            delayed complications were discussed with the                            patient. Return to normal activities tomorrow.  Written discharge instructions were provided to the                            patient.                           - Resume previous diet.                           - Continue present medications.                           - Await pathology results.                           - If persistent left sided abdominal pain I                            recommend CT abd/pelvis with contrast.                           - Repeat colonoscopy is recommended for                            surveillance (likely in 5 years). The colonoscopy                            date will be determined after pathology results                            from today's exam become available for review. Beverley Fiedler, MD 10/26/2023 3:21:43 PM This report has been signed electronically.

## 2023-10-26 NOTE — Progress Notes (Unsigned)
Vss nad trans to pacu 

## 2023-10-26 NOTE — Progress Notes (Unsigned)
GASTROENTEROLOGY PROCEDURE H&P NOTE   Primary Care Physician: Georgina Quint, MD    Reason for Procedure:  Personal history of colonic polyps and rectal bleeding  Plan:    Colonoscopy  Patient is appropriate for endoscopic procedure(s) in the ambulatory ([facility]) setting.  The nature of the procedure, as well as the risks, benefits, and alternatives were carefully and thoroughly reviewed with the patient. Ample time for discussion and questions allowed. The patient understood, was satisfied, and agreed to proceed.     HPI: Victor Nixon is a 57 y.o. male who presents for colonoscopy.  Medical history as below.  Tolerated the prep.  No recent chest pain or shortness of breath.  No abdominal pain today.  Past Medical History:  Diagnosis Date   Allergy    seasonal allergies   Broken arm (left arm) childhood   Heart murmur    Stable - not audible 5 yrs ago   History of kidney stones    Hyperlipidemia     Past Surgical History:  Procedure Laterality Date   COLONOSCOPY  2021   HERNIA REPAIR     age 51 or 8   INGUINAL HERNIA REPAIR Right 06/11/2022   Procedure: LAPAROSCOPIC RIGHT INGUINAL HERNIA;  Surgeon: Abigail Miyamoto, MD;  Location: [facility];  Service: General;  Laterality: Right;   NEPHROLITHOTOMY  06/21/2012   Procedure: NEPHROLITHOTOMY PERCUTANEOUS;  Surgeon: [name], MD;  Location: [facility];  Service: Urology;  Laterality: Left;  left ureteral stent    Prior to Admission medications   Medication Sig Start Date End Date Taking? Authorizing Provider  Multiple Vitamin (MULTIVITAMIN) capsule Take 1 capsule by mouth daily.   Yes [provider]  Omega-3 Fatty Acids (FISH OIL) 1200 MG CAPS Take 2,400 mg by mouth daily.   Yes [provider]  rosuvastatin (CRESTOR) 10 MG tablet TAKE 1 TABLET DAILY 07/03/23  Yes Sagardia, Eilleen Kempf, MD  omeprazole (PRILOSEC) 20 MG capsule Take 1 capsule (20 mg total) by mouth daily. Patient not taking:  Reported on 10/26/2023 10/12/23   Unk Lightning, PA    Current Outpatient Medications  Medication Sig Dispense Refill   Multiple Vitamin (MULTIVITAMIN) capsule Take 1 capsule by mouth daily.     Omega-3 Fatty Acids (FISH OIL) 1200 MG CAPS Take 2,400 mg by mouth daily.     rosuvastatin (CRESTOR) 10 MG tablet TAKE 1 TABLET DAILY 90 tablet 3   omeprazole (PRILOSEC) 20 MG capsule Take 1 capsule (20 mg total) by mouth daily. (Patient not taking: Reported on 10/26/2023) 30 capsule 3   Current Facility-Administered Medications  Medication Dose Route Frequency Provider Last Rate Last Admin   0.9 %  sodium chloride infusion  500 mL Intravenous Once Adonijah Baena, Carie Caddy, MD        Allergies as of 10/26/2023 - Review Complete 10/26/2023  Allergen Reaction Noted   Cephalexin Rash 01/31/2012    Family History  Problem Relation Age of Onset   Hip fracture Mother    Cancer Father        melanoma   Heart disease Father        heart valve replacement   Hyperlipidemia Father    Kidney Stones Son    Colon cancer Neg Hx    Esophageal cancer Neg Hx    Rectal cancer Neg Hx    Stomach cancer Neg Hx    Colon polyps Neg Hx     Social History   Socioeconomic History   Marital  status: Married    Spouse name: Not on file   Number of children: 1   Years of education: Not on file   Highest education level: Not on file  Occupational History   Occupation: sales  Tobacco Use   Smoking status: Never   Smokeless tobacco: Never  Vaping Use   Vaping status: Never Used  Substance and Sexual Activity   Alcohol use: Yes    Alcohol/week: 1.0 - 2.0 standard drink of alcohol    Types: 1 - 2 Glasses of wine per week    Comment: occassional beer, liquor   Drug use: No   Sexual activity: Yes    Partners: Female    Birth control/protection: Abstinence  Other Topics Concern   Not on file  Social History Narrative   Not on file   Social Determinants of Health   Financial Resource Strain: Not on  file  Food Insecurity: Not on file  Transportation Needs: Not on file  Physical Activity: Not on file  Stress: Not on file  Social Connections: Not on file  Intimate Partner Violence: Not on file    Physical Exam: Vital signs in last 24 hours: @BP  132/70   Pulse 60   Temp 97.9 F (36.6 C) (Temporal)   Ht 5' 9.5" (1.765 m)   Wt 183 lb (83 kg)   SpO2 98%   BMI 26.64 kg/m  GEN: NAD EYE: Sclerae anicteric ENT: MMM CV: Non-tachycardic Pulm: CTA b/l GI: Soft, NT/ND NEURO:  Alert & Oriented x 3   Erick Blinks, MD [facility]  10/26/2023 2:48 PM

## 2023-10-26 NOTE — Progress Notes (Unsigned)
Called to room to assist during endoscopic procedure.  Patient ID and intended procedure confirmed with present staff. Received instructions for my participation in the procedure from the performing physician.  

## 2023-10-27 ENCOUNTER — Telehealth: Payer: Self-pay

## 2023-10-27 NOTE — Telephone Encounter (Signed)
  Follow up Call-     10/26/2023    2:29 PM  Call back number  Post procedure Call Back phone  # [phone]  Permission to leave phone message Yes     Patient questions:  Do you have a fever, pain , or abdominal swelling? No. Pain Score  0 *  Have you tolerated food without any problems? Yes.    Have you been able to return to your normal activities? Yes.    Do you have any questions about your discharge instructions: Diet   No. Medications  No. Follow up visit  No.  Do you have questions or concerns about your Care? No.  Actions: * If pain score is 4 or above: No action needed, pain <4.

## 2023-10-29 LAB — SURGICAL PATHOLOGY

## 2023-10-30 ENCOUNTER — Encounter: Payer: Self-pay | Admitting: Internal Medicine

## 2024-01-06 ENCOUNTER — Encounter: Payer: Self-pay | Admitting: Emergency Medicine

## 2024-01-06 ENCOUNTER — Other Ambulatory Visit (INDEPENDENT_AMBULATORY_CARE_PROVIDER_SITE_OTHER): Payer: Managed Care, Other (non HMO)

## 2024-01-06 ENCOUNTER — Telehealth: Payer: Self-pay | Admitting: Emergency Medicine

## 2024-01-06 ENCOUNTER — Ambulatory Visit: Payer: Managed Care, Other (non HMO) | Admitting: Emergency Medicine

## 2024-01-06 VITALS — BP 124/84 | HR 57 | Temp 98.3°F | Ht 69.5 in | Wt 184.0 lb

## 2024-01-06 DIAGNOSIS — Z125 Encounter for screening for malignant neoplasm of prostate: Secondary | ICD-10-CM

## 2024-01-06 DIAGNOSIS — Z0000 Encounter for general adult medical examination without abnormal findings: Secondary | ICD-10-CM

## 2024-01-06 DIAGNOSIS — Z13228 Encounter for screening for other metabolic disorders: Secondary | ICD-10-CM

## 2024-01-06 DIAGNOSIS — Z1329 Encounter for screening for other suspected endocrine disorder: Secondary | ICD-10-CM

## 2024-01-06 DIAGNOSIS — Z13220 Encounter for screening for lipoid disorders: Secondary | ICD-10-CM

## 2024-01-06 DIAGNOSIS — Z130 Encounter for screening for diseases of the blood and blood-forming organs and certain disorders involving the immune mechanism: Secondary | ICD-10-CM

## 2024-01-06 LAB — PSA: PSA: 0.36 ng/mL (ref 0.10–4.00)

## 2024-01-06 LAB — CBC WITH DIFFERENTIAL/PLATELET
Basophils Absolute: 0.1 10*3/uL (ref 0.0–0.1)
Basophils Relative: 2.1 % (ref 0.0–3.0)
Eosinophils Absolute: 0.2 10*3/uL (ref 0.0–0.7)
Eosinophils Relative: 3.1 % (ref 0.0–5.0)
HCT: 44.2 % (ref 39.0–52.0)
Hemoglobin: 15 g/dL (ref 13.0–17.0)
Lymphocytes Relative: 34.4 % (ref 12.0–46.0)
Lymphs Abs: 1.7 10*3/uL (ref 0.7–4.0)
MCHC: 33.9 g/dL (ref 30.0–36.0)
MCV: 91.5 fL (ref 78.0–100.0)
Monocytes Absolute: 0.6 10*3/uL (ref 0.1–1.0)
Monocytes Relative: 11 % (ref 3.0–12.0)
Neutro Abs: 2.5 10*3/uL (ref 1.4–7.7)
Neutrophils Relative %: 49.4 % (ref 43.0–77.0)
Platelets: 329 10*3/uL (ref 150.0–400.0)
RBC: 4.83 Mil/uL (ref 4.22–5.81)
RDW: 13 % (ref 11.5–15.5)
WBC: 5 10*3/uL (ref 4.0–10.5)

## 2024-01-06 LAB — COMPREHENSIVE METABOLIC PANEL
ALT: 19 U/L (ref 0–53)
AST: 25 U/L (ref 0–37)
Albumin: 4.5 g/dL (ref 3.5–5.2)
Alkaline Phosphatase: 63 U/L (ref 39–117)
BUN: 15 mg/dL (ref 6–23)
CO2: 29 meq/L (ref 19–32)
Calcium: 9.1 mg/dL (ref 8.4–10.5)
Chloride: 102 meq/L (ref 96–112)
Creatinine, Ser: 0.9 mg/dL (ref 0.40–1.50)
GFR: 96.31 mL/min (ref >60.00)
Glucose, Bld: 91 mg/dL (ref 70–99)
Potassium: 4.3 meq/L (ref 3.5–5.1)
Sodium: 139 meq/L (ref 135–145)
Total Bilirubin: 0.8 mg/dL (ref 0.2–1.2)
Total Protein: 7.3 g/dL (ref 6.0–8.3)

## 2024-01-06 LAB — LIPID PANEL
Cholesterol: 170 mg/dL (ref 0–200)
HDL: 67.8 mg/dL (ref >39.00)
LDL Cholesterol: 90 mg/dL (ref 0–99)
NonHDL: 102.38
Total CHOL/HDL Ratio: 3
Triglycerides: 60 mg/dL (ref 0.0–149.0)
VLDL: 12 mg/dL (ref 0.0–40.0)

## 2024-01-06 LAB — HEMOGLOBIN A1C: Hgb A1c MFr Bld: 5.7 % (ref 4.6–6.5)

## 2024-01-06 NOTE — Telephone Encounter (Signed)
Orders placed.

## 2024-01-06 NOTE — Patient Instructions (Signed)
Health Maintenance, Male  Adopting a healthy lifestyle and getting preventive care are important in promoting health and wellness. Ask your health care provider about:  The right schedule for you to have regular tests and exams.  Things you can do on your own to prevent diseases and keep yourself healthy.  What should I know about diet, weight, and exercise?  Eat a healthy diet    Eat a diet that includes plenty of vegetables, fruits, low-fat dairy products, and lean protein.  Do not eat a lot of foods that are high in solid fats, added sugars, or sodium.  Maintain a healthy weight  Body mass index (BMI) is a measurement that can be used to identify possible weight problems. It estimates body fat based on height and weight. Your health care provider can help determine your BMI and help you achieve or maintain a healthy weight.  Get regular exercise  Get regular exercise. This is one of the most important things you can do for your health. Most adults should:  Exercise for at least 150 minutes each week. The exercise should increase your heart rate and make you sweat (moderate-intensity exercise).  Do strengthening exercises at least twice a week. This is in addition to the moderate-intensity exercise.  Spend less time sitting. Even light physical activity can be beneficial.  Watch cholesterol and blood lipids  Have your blood tested for lipids and cholesterol at 56 years of age, then have this test every 5 years.  You may need to have your cholesterol levels checked more often if:  Your lipid or cholesterol levels are high.  You are older than 56 years of age.  You are at high risk for heart disease.  What should I know about cancer screening?  Many types of cancers can be detected early and may often be prevented. Depending on your health history and family history, you may need to have cancer screening at various ages. This may include screening for:  Colorectal cancer.  Prostate cancer.  Skin cancer.  Lung  cancer.  What should I know about heart disease, diabetes, and high blood pressure?  Blood pressure and heart disease  High blood pressure causes heart disease and increases the risk of stroke. This is more likely to develop in people who have high blood pressure readings or are overweight.  Talk with your health care provider about your target blood pressure readings.  Have your blood pressure checked:  Every 3-5 years if you are 9-95 years of age.  Every year if you are 85 years old or older.  If you are between the ages of 29 and 29 and are a current or former smoker, ask your health care provider if you should have a one-time screening for abdominal aortic aneurysm (AAA).  Diabetes  Have regular diabetes screenings. This checks your fasting blood sugar level. Have the screening done:  Once every three years after age 23 if you are at a normal weight and have a low risk for diabetes.  More often and at a younger age if you are overweight or have a high risk for diabetes.  What should I know about preventing infection?  Hepatitis B  If you have a higher risk for hepatitis B, you should be screened for this virus. Talk with your health care provider to find out if you are at risk for hepatitis B infection.  Hepatitis C  Blood testing is recommended for:  Everyone born from [birth year] through 1963.  Anyone  with known risk factors for hepatitis C.  Sexually transmitted infections (STIs)  You should be screened each year for STIs, including gonorrhea and chlamydia, if:  You are sexually active and are younger than 56 years of age.  You are older than 56 years of age and your health care provider tells you that you are at risk for this type of infection.  Your sexual activity has changed since you were last screened, and you are at increased risk for chlamydia or gonorrhea. Ask your health care provider if you are at risk.  Ask your health care provider about whether you are at high risk for HIV. Your health care provider  may recommend a prescription medicine to help prevent HIV infection. If you choose to take medicine to prevent HIV, you should first get tested for HIV. You should then be tested every 3 months for as long as you are taking the medicine.  Follow these instructions at home:  Alcohol use  Do not drink alcohol if your health care provider tells you not to drink.  If you drink alcohol:  Limit how much you have to 0-2 drinks a day.  Know how much alcohol is in your drink. In the [place] one drink equals one 12 oz bottle of beer (355 mL), one 5 oz glass of wine (148 mL), or one 1 oz glass of hard liquor (44 mL).  Lifestyle  Do not use any products that contain nicotine or tobacco. These products include cigarettes, chewing tobacco, and vaping devices, such as e-cigarettes. If you need help quitting, ask your health care provider.  Do not use street drugs.  Do not share needles.  Ask your health care provider for help if you need support or information about quitting drugs.  General instructions  Schedule regular health, dental, and eye exams.  Stay current with your vaccines.  Tell your health care provider if:  You often feel depressed.  You have ever been abused or do not feel safe at home.  Summary  Adopting a healthy lifestyle and getting preventive care are important in promoting health and wellness.  Follow your health care provider's instructions about healthy diet, exercising, and getting tested or screened for diseases.  Follow your health care provider's instructions on monitoring your cholesterol and blood pressure.  This information is not intended to replace advice given to you by your health care provider. Make sure you discuss any questions you have with your health care provider.  Document Revised: 04/01/2021 Document Reviewed: 04/01/2021  Elsevier Patient Education  2024 [employer].

## 2024-01-06 NOTE — Progress Notes (Signed)
 Victor Nixon 58 y.o.   Chief Complaint  Patient presents with   Annual Exam    Patient states he has a lump on his forehead that has been there for years and just wants it looked     HISTORY OF PRESENT ILLNESS: This is a 58 y.o. male here for annual exam. Overall doing well.  Has no complaints or medical concerns today Eating much better and exercising more.  Staying healthy and fit.  HPI   Prior to Admission medications   Medication Sig Start Date End Date Taking? Authorizing Provider  Multiple Vitamin (MULTIVITAMIN) capsule Take 1 capsule by mouth daily.   Yes [provider]  Omega-3 Fatty Acids (FISH OIL) 1200 MG CAPS Take 2,400 mg by mouth daily.   Yes [provider]  rosuvastatin (CRESTOR) 10 MG tablet TAKE 1 TABLET DAILY 07/03/23  Yes Ben Habermann, Eilleen Kempf, MD  omeprazole (PRILOSEC) 20 MG capsule Take 1 capsule (20 mg total) by mouth daily. Patient not taking: Reported on 10/26/2023 10/12/23   Unk Lightning, PA    Allergies  Allergen Reactions   Cephalexin Rash    Patient Active Problem List   Diagnosis Date Noted   HLD (hyperlipidemia) 04/24/2022   Family history of melanoma 02/18/2016    Past Medical History:  Diagnosis Date   Allergy    seasonal allergies   Broken arm (left arm) childhood   Heart murmur    Stable - not audible 5 yrs ago   History of kidney stones    Hyperlipidemia     Past Surgical History:  Procedure Laterality Date   COLONOSCOPY  2021   HERNIA REPAIR     age 89 or 8   INGUINAL HERNIA REPAIR Right 06/11/2022   Procedure: LAPAROSCOPIC RIGHT INGUINAL HERNIA;  Surgeon: Abigail Miyamoto, MD;  Location: [facility];  Service: General;  Laterality: Right;   NEPHROLITHOTOMY  06/21/2012   Procedure: NEPHROLITHOTOMY PERCUTANEOUS;  Surgeon: [name], MD;  Location: [facility];  Service: Urology;  Laterality: Left;  left ureteral stent    Social History   Socioeconomic History   Marital status: Married     Spouse name: Not on file   Number of children: 1   Years of education: Not on file   Highest education level: Not on file  Occupational History   Occupation: sales  Tobacco Use   Smoking status: Never   Smokeless tobacco: Never  Vaping Use   Vaping status: Never Used  Substance and Sexual Activity   Alcohol use: Yes    Alcohol/week: 1.0 - 2.0 standard drink of alcohol    Types: 1 - 2 Glasses of wine per week    Comment: occassional beer, liquor   Drug use: No   Sexual activity: Yes    Partners: Female    Birth control/protection: Abstinence  Other Topics Concern   Not on file  Social History Narrative   Not on file   Social Drivers of Health   Financial Resource Strain: Not on file  Food Insecurity: Not on file  Transportation Needs: Not on file  Physical Activity: Not on file  Stress: Not on file  Social Connections: Not on file  Intimate Partner Violence: Not on file    Family History  Problem Relation Age of Onset   Hip fracture Mother    Cancer Father        melanoma   Heart disease Father        heart valve replacement  Hyperlipidemia Father    Kidney Stones Son    Colon cancer Neg Hx    Esophageal cancer Neg Hx    Rectal cancer Neg Hx    Stomach cancer Neg Hx    Colon polyps Neg Hx      Review of Systems  Constitutional: Negative.  Negative for chills and fever.  HENT: Negative.  Negative for congestion and sore throat.   Respiratory: Negative.  Negative for cough and shortness of breath.   Cardiovascular: Negative.  Negative for chest pain and palpitations.  Gastrointestinal:  Negative for abdominal pain, diarrhea, nausea and vomiting.  Genitourinary: Negative.  Negative for dysuria and hematuria.  Skin: Negative.  Negative for rash.  Neurological: Negative.  Negative for dizziness and headaches.  All other systems reviewed and are negative.   Today's Vitals   01/06/24 1307  BP: 124/84  Pulse: (!) 57  Temp: 98.3 F (36.8 C)  TempSrc: Oral   SpO2: 99%  Weight: 184 lb (83.5 kg)  Height: 5' 9.5" (1.765 m)   Body mass index is 26.78 kg/m.   Physical Exam Constitutional:      Appearance: Normal appearance.  HENT:     Head: Normocephalic.     Right Ear: Tympanic membrane, ear canal and external ear normal.     Left Ear: Tympanic membrane, ear canal and external ear normal.     Mouth/Throat:     Mouth: Mucous membranes are moist.     Pharynx: Oropharynx is clear.  Eyes:     Extraocular Movements: Extraocular movements intact.     Conjunctiva/sclera: Conjunctivae normal.     Pupils: Pupils are equal, round, and reactive to light.  Cardiovascular:     Rate and Rhythm: Normal rate and regular rhythm.     Pulses: Normal pulses.     Heart sounds: Normal heart sounds.  Pulmonary:     Effort: Pulmonary effort is normal.     Breath sounds: Normal breath sounds.  Abdominal:     Palpations: Abdomen is soft.     Tenderness: There is no abdominal tenderness.  Musculoskeletal:        General: Normal range of motion.     Cervical back: No tenderness.  Lymphadenopathy:     Cervical: No cervical adenopathy.  Skin:    General: Skin is warm and dry.     Capillary Refill: Capillary refill takes less than 2 seconds.  Neurological:     Mental Status: He is alert and oriented to person, place, and time.  Psychiatric:        Mood and Affect: Mood normal.        Behavior: Behavior normal.      ASSESSMENT & PLAN: Problem List Items Addressed This Visit   None Visit Diagnoses       Routine general medical examination at a health care facility    -  Primary   Relevant Orders   CBC with Differential/Platelet (Completed)   Comprehensive metabolic panel (Completed)   Hemoglobin A1c (Completed)   Lipid panel (Completed)   PSA (Completed)     Screening for deficiency anemia       Relevant Orders   CBC with Differential/Platelet (Completed)     Screening for lipoid disorders       Relevant Orders   Lipid panel (Completed)      Screening for endocrine, metabolic and immunity disorder       Relevant Orders   Comprehensive metabolic panel (Completed)   Hemoglobin A1c (Completed)  Screening for prostate cancer       Relevant Orders   PSA (Completed)      Modifiable risk factors discussed with patient. Anticipatory guidance according to age provided. The following topics were also discussed: Social Determinants of Health Smoking.  Non-smoker Diet and nutrition Benefits of exercise Cancer screening and review of colonoscopy report done December 2024 Vaccinations reviewed and recommendations Cardiovascular risk assessment The 10-year ASCVD risk score (Arnett DK, et al., 2019) is: 3.4%   Values used to calculate the score:     Age: 64 years     Sex: Male     Is Non-Hispanic African American: No     Diabetic: No     Tobacco smoker: No     Systolic Blood Pressure: 124 mmHg     Is BP treated: No     HDL Cholesterol: 67.8 mg/dL     Total Cholesterol: 170 mg/dL  Mental health including depression and anxiety Fall and accident prevention  Patient Instructions  Health Maintenance, Male Adopting a healthy lifestyle and getting preventive care are important in promoting health and wellness. Ask your health care provider about: The right schedule for you to have regular tests and exams. Things you can do on your own to prevent diseases and keep yourself healthy. What should I know about diet, weight, and exercise? Eat a healthy diet  Eat a diet that includes plenty of vegetables, fruits, low-fat dairy products, and lean protein. Do not eat a lot of foods that are high in solid fats, added sugars, or sodium. Maintain a healthy weight Body mass index (BMI) is a measurement that can be used to identify possible weight problems. It estimates body fat based on height and weight. Your health care provider can help determine your BMI and help you achieve or maintain a healthy weight. Get regular exercise Get  regular exercise. This is one of the most important things you can do for your health. Most adults should: Exercise for at least 150 minutes each week. The exercise should increase your heart rate and make you sweat (moderate-intensity exercise). Do strengthening exercises at least twice a week. This is in addition to the moderate-intensity exercise. Spend less time sitting. Even light physical activity can be beneficial. Watch cholesterol and blood lipids Have your blood tested for lipids and cholesterol at 56 years of age, then have this test every 5 years. You may need to have your cholesterol levels checked more often if: Your lipid or cholesterol levels are high. You are older than 56 years of age. You are at high risk for heart disease. What should I know about cancer screening? Many types of cancers can be detected early and may often be prevented. Depending on your health history and family history, you may need to have cancer screening at various ages. This may include screening for: Colorectal cancer. Prostate cancer. Skin cancer. Lung cancer. What should I know about heart disease, diabetes, and high blood pressure? Blood pressure and heart disease High blood pressure causes heart disease and increases the risk of stroke. This is more likely to develop in people who have high blood pressure readings or are overweight. Talk with your health care provider about your target blood pressure readings. Have your blood pressure checked: Every 3-5 years if you are 70-17 years of age. Every year if you are 49 years old or older. If you are between the ages of 66 and 6 and are a current or former smoker, ask your health  care provider if you should have a one-time screening for abdominal aortic aneurysm (AAA). Diabetes Have regular diabetes screenings. This checks your fasting blood sugar level. Have the screening done: Once every three years after age 48 if you are at a normal weight and  have a low risk for diabetes. More often and at a younger age if you are overweight or have a high risk for diabetes. What should I know about preventing infection? Hepatitis B If you have a higher risk for hepatitis B, you should be screened for this virus. Talk with your health care provider to find out if you are at risk for hepatitis B infection. Hepatitis C Blood testing is recommended for: Everyone born from [birth year] through 1963. Anyone with known risk factors for hepatitis C. Sexually transmitted infections (STIs) You should be screened each year for STIs, including gonorrhea and chlamydia, if: You are sexually active and are younger than 56 years of age. You are older than 56 years of age and your health care provider tells you that you are at risk for this type of infection. Your sexual activity has changed since you were last screened, and you are at increased risk for chlamydia or gonorrhea. Ask your health care provider if you are at risk. Ask your health care provider about whether you are at high risk for HIV. Your health care provider may recommend a prescription medicine to help prevent HIV infection. If you choose to take medicine to prevent HIV, you should first get tested for HIV. You should then be tested every 3 months for as long as you are taking the medicine. Follow these instructions at home: Alcohol use Do not drink alcohol if your health care provider tells you not to drink. If you drink alcohol: Limit how much you have to 0-2 drinks a day. Know how much alcohol is in your drink. In the [place] one drink equals one 12 oz bottle of beer (355 mL), one 5 oz glass of wine (148 mL), or one 1 oz glass of hard liquor (44 mL). Lifestyle Do not use any products that contain nicotine or tobacco. These products include cigarettes, chewing tobacco, and vaping devices, such as e-cigarettes. If you need help quitting, ask your health care provider. Do not use street drugs. Do not  share needles. Ask your health care provider for help if you need support or information about quitting drugs. General instructions Schedule regular health, dental, and eye exams. Stay current with your vaccines. Tell your health care provider if: You often feel depressed. You have ever been abused or do not feel safe at home. Summary Adopting a healthy lifestyle and getting preventive care are important in promoting health and wellness. Follow your health care provider's instructions about healthy diet, exercising, and getting tested or screened for diseases. Follow your health care provider's instructions on monitoring your cholesterol and blood pressure. This information is not intended to replace advice given to you by your health care provider. Make sure you discuss any questions you have with your health care provider. Document Revised: 04/01/2021 Document Reviewed: 04/01/2021 Elsevier Patient Education  2024 Elsevier Inc.      Edwina Barth, MD [facility]

## 2024-01-06 NOTE — Telephone Encounter (Signed)
Patient was scheduled for blood work with no orders in the system. He has his physical later today and would like the labs ordered ASAP.

## 2024-01-25 ENCOUNTER — Ambulatory Visit (INDEPENDENT_AMBULATORY_CARE_PROVIDER_SITE_OTHER)

## 2024-01-25 ENCOUNTER — Ambulatory Visit: Payer: Self-pay | Admitting: Emergency Medicine

## 2024-01-25 ENCOUNTER — Ambulatory Visit (HOSPITAL_COMMUNITY)
Admission: RE | Admit: 2024-01-25 | Discharge: 2024-01-25 | Disposition: A | Discharge status: Home / Self Care | Source: Ambulatory Visit | Attending: Family Medicine | Admitting: Family Medicine

## 2024-01-25 ENCOUNTER — Encounter (HOSPITAL_COMMUNITY): Payer: Self-pay

## 2024-01-25 VITALS — BP 136/80 | HR 57 | Temp 98.2°F | Resp 16

## 2024-01-25 DIAGNOSIS — M79644 Pain in right finger(s): Secondary | ICD-10-CM

## 2024-01-25 MED ORDER — IBUPROFEN 800 MG PO TABS: 800.0000 mg | ORAL_TABLET | Freq: Three times a day (TID) | ORAL | 0 refills | Status: DC | PRN

## 2024-01-25 NOTE — Telephone Encounter (Signed)
 Communication  Red Word that prompted transfer to Nurse Triage: Jammed finger over the weekend, states its swollen and not sure if its broken.   Chief Complaint: "Jammed" right ring finger. Swollen, unable to bend. No availability in [facility] practices. Declines another office. Symptoms: Swelling Frequency: Yesterday Pertinent Negatives: Patient denies  Disposition: [] ED /[x] Urgent Care (no appt availability in office) / [] Appointment(In office/virtual)/ []  [facility]/ [] Home Care/ [] Refused Recommended Disposition /[] [facility]/ []  Follow-up with PCP Additional Notes: Pt. Agrees with UC.  Reason for Disposition  Finger joint can't be opened (straightened) or closed (bent) completely  (Note: injured person should be able to do this without assistance)  Answer Assessment - Initial Assessment Questions 1. MECHANISM: "How did the injury happen?"      Jammed - loading brush 2. ONSET: "When did the injury happen?" (Minutes or hours ago)      Yesterday 3. LOCATION: "What part of the finger is injured?" "Is the nail damaged?"      Right ring finger 4. APPEARANCE of the INJURY: "What does the injury look like?"      Swollen, red 5. SEVERITY: "Can you use the hand normally?"  "Can you bend your fingers into a ball and then fully open them?"     Stiff 6. SIZE: For cuts, bruises, or swelling, ask: "How large is it?" (e.g., inches or centimeters;  entire finger)      No 7. PAIN: "Is there pain?" If Yes, ask: "How bad is the pain?"    (e.g., Scale 1-10; or mild, moderate, severe)  - NONE (0): no pain.  - MILD (1-3): doesn't interfere with normal activities.   - MODERATE (4-7): interferes with normal activities or awakens from sleep.  - SEVERE (8-10): excruciating pain, unable to hold a glass of water or bend finger even a little.     Mild-moderate 8. TETANUS: For any breaks in the skin, ask: "When was the last tetanus booster?"     N/a 9. OTHER SYMPTOMS: "Do you have  any other symptoms?"     No 10. PREGNANCY: "Is there any chance you are pregnant?" "When was your last menstrual period?"       N/a  Protocols used: Finger Injury-A-AH

## 2024-01-25 NOTE — ED Provider Notes (Signed)
 [facility]    CSN: [id number] Arrival date & time: 01/25/24  1303      History   Chief Complaint Chief Complaint  Patient presents with   Finger Injury    Jammed my finger - Entered by patient    HPI Victor Nixon is a 58 y.o. male.   HPI Here for pain in his right ring finger.  It is mainly painful full over his proximal phalanx.Burgess Estelle he was loading some limbs and brush into his truck and he jammed the end of his right ring finger on a piece of wood.  He can bend it and extend it.  It is swollen some.  He is allergic to cephalexin Past Medical History:  Diagnosis Date   Allergy    seasonal allergies   Broken arm (left arm) childhood   Heart murmur    Stable - not audible 5 yrs ago   History of kidney stones    Hyperlipidemia     Patient Active Problem List   Diagnosis Date Noted   HLD (hyperlipidemia) 04/24/2022   Family history of melanoma 02/18/2016    Past Surgical History:  Procedure Laterality Date   COLONOSCOPY  2021   HERNIA REPAIR     age 3 or 8   INGUINAL HERNIA REPAIR Right 06/11/2022   Procedure: LAPAROSCOPIC RIGHT INGUINAL HERNIA;  Surgeon: Abigail Miyamoto, MD;  Location: [facility];  Service: General;  Laterality: Right;   NEPHROLITHOTOMY  06/21/2012   Procedure: NEPHROLITHOTOMY PERCUTANEOUS;  Surgeon: [name], MD;  Location: [facility];  Service: Urology;  Laterality: Left;  left ureteral stent       Home Medications    Prior to Admission medications   Medication Sig Start Date End Date Taking? Authorizing Provider  ibuprofen (ADVIL) 800 MG tablet Take 1 tablet (800 mg total) by mouth every 8 (eight) hours as needed (pain). 01/25/24  Yes Zenia Resides, MD  Multiple Vitamin (MULTIVITAMIN) capsule Take 1 capsule by mouth daily.   Yes [provider]  Omega-3 Fatty Acids (FISH OIL) 1200 MG CAPS Take 2,400 mg by mouth daily.   Yes [provider]  rosuvastatin (CRESTOR) 10 MG tablet TAKE 1 TABLET  DAILY 07/03/23  Yes Sagardia, Eilleen Kempf, MD  omeprazole (PRILOSEC) 20 MG capsule Take 1 capsule (20 mg total) by mouth daily. 10/12/23   Unk Lightning, PA    Family History Family History  Problem Relation Age of Onset   Hip fracture Mother    Cancer Father        melanoma   Heart disease Father        heart valve replacement   Hyperlipidemia Father    Kidney Stones Son    Colon cancer Neg Hx    Esophageal cancer Neg Hx    Rectal cancer Neg Hx    Stomach cancer Neg Hx    Colon polyps Neg Hx     Social History Social History   Tobacco Use   Smoking status: Never   Smokeless tobacco: Never  Vaping Use   Vaping status: Never Used  Substance Use Topics   Alcohol use: Yes    Alcohol/week: 1.0 - 2.0 standard drink of alcohol    Types: 1 - 2 Glasses of wine per week    Comment: occassional beer, liquor   Drug use: No     Allergies   Cephalexin   Review of Systems Review of Systems   Physical Exam Triage Vital  Signs ED Triage Vitals  Encounter Vitals Group     BP 01/25/24 1315 136/80     Systolic BP Percentile --      Diastolic BP Percentile --      Pulse Rate 01/25/24 1315 (!) 57     Resp 01/25/24 1315 16     Temp 01/25/24 1315 98.2 F (36.8 C)     Temp Source 01/25/24 1315 Oral     SpO2 01/25/24 1315 97 %     Weight --      Height --      Head Circumference --      Peak Flow --      Pain Score 01/25/24 1314 1     Pain Loc --      Pain Education --      Exclude from Growth Chart --    No data found.  Updated Vital Signs BP 136/80 (BP Location: Left Arm)   Pulse (!) 57   Temp 98.2 F (36.8 C) (Oral)   Resp 16   SpO2 97%   Visual Acuity Right Eye Distance:   Left Eye Distance:   Bilateral Distance:    Right Eye Near:   Left Eye Near:    Bilateral Near:     Physical Exam Vitals reviewed.  Constitutional:      General: He is not in acute distress.    Appearance: He is not ill-appearing, toxic-appearing or diaphoretic.   Musculoskeletal:     Comments: The right ring finger is mildly swollen and a little ecchymotic on the dorsal surface over the proximal and middle phalanx.  Skin:    Coloration: Skin is not jaundiced or pale.  Neurological:     Mental Status: He is alert and oriented to person, place, and time.  Psychiatric:        Behavior: Behavior normal.      UC Treatments / Results  Labs (all labs ordered are listed, but only abnormal results are displayed) Labs Reviewed - No data to display  EKG   Radiology No results found.  Procedures Procedures (including critical care time)  Medications Ordered in UC Medications - No data to display  Initial Impression / Assessment and Plan / UC Course  I have reviewed the triage vital signs and the nursing notes.  Pertinent labs & imaging results that were available during my care of the patient were reviewed by me and considered in my medical decision making (see chart for details).     X-ray by my review does not show any acute fracture.  He is advised of radiology over read.  Ibuprofen 800 mg is sent to the pharmacy.  Last EGFR in February was 96 Final Clinical Impressions(s) / UC Diagnoses   Final diagnoses:  Finger pain, right     Discharge Instructions      By my review there is no fracture on your x-ray.  The radiologist will also read your x-ray, and if their interpretation differs significantly from mine, and the management of your condition would change, we will call you.  Take ibuprofen 800 mg--1 tab every 8 hours as needed for pain.  I do think it will help to ice and elevate your finger tonight and tomorrow when you can.     ED Prescriptions     Medication Sig Dispense Auth. Provider   ibuprofen (ADVIL) 800 MG tablet Take 1 tablet (800 mg total) by mouth every 8 (eight) hours as needed (pain). 21 tablet Zenia Resides,  MD      PDMP not reviewed this encounter.   Zenia Resides, MD 01/25/24 [phone]

## 2024-01-25 NOTE — ED Triage Notes (Signed)
 Pt states that he was working in yard yesterday and he jammed his right ring finger. He states it swollen and is hurting a little.

## 2024-01-25 NOTE — Discharge Instructions (Addendum)
 By my review there is no fracture on your x-ray.  The radiologist will also read your x-ray, and if their interpretation differs significantly from mine, and the management of your condition would change, we will call you.  Take ibuprofen 800 mg--1 tab every 8 hours as needed for pain.  I do think it will help to ice and elevate your finger tonight and tomorrow when you can.

## 2024-02-10 ENCOUNTER — Emergency Department (HOSPITAL_BASED_OUTPATIENT_CLINIC_OR_DEPARTMENT_OTHER)

## 2024-02-10 ENCOUNTER — Emergency Department (HOSPITAL_BASED_OUTPATIENT_CLINIC_OR_DEPARTMENT_OTHER)
Admission: EM | Admit: 2024-02-10 | Discharge: 2024-02-10 | Disposition: A | Discharge status: Home / Self Care | Source: Home / Self Care | Attending: Emergency Medicine | Admitting: Emergency Medicine

## 2024-02-10 ENCOUNTER — Other Ambulatory Visit: Payer: Self-pay

## 2024-02-10 ENCOUNTER — Encounter (HOSPITAL_BASED_OUTPATIENT_CLINIC_OR_DEPARTMENT_OTHER): Payer: Self-pay | Admitting: Emergency Medicine

## 2024-02-10 DIAGNOSIS — K56609 Unspecified intestinal obstruction, unspecified as to partial versus complete obstruction: Secondary | ICD-10-CM | POA: Diagnosis not present

## 2024-02-10 DIAGNOSIS — K567 Ileus, unspecified: Secondary | ICD-10-CM | POA: Insufficient documentation

## 2024-02-10 DIAGNOSIS — K5651 Intestinal adhesions [bands], with partial obstruction: Secondary | ICD-10-CM | POA: Diagnosis not present

## 2024-02-10 DIAGNOSIS — K529 Noninfective gastroenteritis and colitis, unspecified: Secondary | ICD-10-CM | POA: Insufficient documentation

## 2024-02-10 LAB — CBC WITH DIFFERENTIAL/PLATELET
Abs Immature Granulocytes: 0.02 10*3/uL (ref 0.00–0.07)
Basophils Absolute: 0.1 10*3/uL (ref 0.0–0.1)
Basophils Relative: 1 %
Eosinophils Absolute: 0.2 10*3/uL (ref 0.0–0.5)
Eosinophils Relative: 2 %
HCT: 43.7 % (ref 39.0–52.0)
Hemoglobin: 14.8 g/dL (ref 13.0–17.0)
Immature Granulocytes: 0 %
Lymphocytes Relative: 44 %
Lymphs Abs: 3.4 10*3/uL (ref 0.7–4.0)
MCH: 30.7 pg (ref 26.0–34.0)
MCHC: 33.9 g/dL (ref 30.0–36.0)
MCV: 90.7 fL (ref 80.0–100.0)
Monocytes Absolute: 0.6 10*3/uL (ref 0.1–1.0)
Monocytes Relative: 8 %
Neutro Abs: 3.5 10*3/uL (ref 1.7–7.7)
Neutrophils Relative %: 45 %
Platelets: 382 10*3/uL (ref 150–400)
RBC: 4.82 MIL/uL (ref 4.22–5.81)
RDW: 12.8 % (ref 11.5–15.5)
WBC: 7.8 10*3/uL (ref 4.0–10.5)
nRBC: 0 % (ref 0.0–0.2)

## 2024-02-10 LAB — URINALYSIS, ROUTINE W REFLEX MICROSCOPIC
Bilirubin Urine: NEGATIVE
Glucose, UA: NEGATIVE mg/dL
Hgb urine dipstick: NEGATIVE
Ketones, ur: 15 mg/dL — AB
Leukocytes,Ua: NEGATIVE
Nitrite: NEGATIVE
Protein, ur: NEGATIVE mg/dL
Specific Gravity, Urine: 1.025 (ref 1.005–1.030)
pH: 6 (ref 5.0–8.0)

## 2024-02-10 LAB — COMPREHENSIVE METABOLIC PANEL
ALT: 28 U/L (ref 0–44)
AST: 30 U/L (ref 15–41)
Albumin: 4.4 g/dL (ref 3.5–5.0)
Alkaline Phosphatase: 60 U/L (ref 38–126)
Anion gap: 14 (ref 5–15)
BUN: 16 mg/dL (ref 6–20)
CO2: 24 mmol/L (ref 22–32)
Calcium: 9.1 mg/dL (ref 8.9–10.3)
Chloride: 100 mmol/L (ref 98–111)
Creatinine, Ser: 0.89 mg/dL (ref 0.61–1.24)
GFR, Estimated: >60 mL/min (ref >60)
Glucose, Bld: 123 mg/dL — ABNORMAL HIGH (ref 70–99)
Potassium: 3.4 mmol/L — ABNORMAL LOW (ref 3.5–5.1)
Sodium: 138 mmol/L (ref 135–145)
Total Bilirubin: 1 mg/dL (ref 0.0–1.2)
Total Protein: 7.9 g/dL (ref 6.5–8.1)

## 2024-02-10 LAB — LIPASE, BLOOD: Lipase: 30 U/L (ref 11–51)

## 2024-02-10 MED ORDER — MORPHINE SULFATE (PF) 4 MG/ML IV SOLN
4.0000 mg | Freq: Once | INTRAVENOUS | Status: AC
  Administered 2024-02-10: 4 mg via INTRAVENOUS
  Filled 2024-02-10: qty 1

## 2024-02-10 MED ORDER — SODIUM CHLORIDE 0.9 % IV BOLUS
1000.0000 mL | Freq: Once | INTRAVENOUS | Status: AC
  Administered 2024-02-10: 1000 mL via INTRAVENOUS

## 2024-02-10 MED ORDER — ONDANSETRON HCL 4 MG/2ML IJ SOLN
4.0000 mg | Freq: Once | INTRAMUSCULAR | Status: DC
  Filled 2024-02-10: qty 2

## 2024-02-10 MED ORDER — HYDROMORPHONE HCL 1 MG/ML IJ SOLN
1.0000 mg | Freq: Once | INTRAMUSCULAR | Status: DC
  Filled 2024-02-10: qty 1

## 2024-02-10 MED ORDER — ONDANSETRON 4 MG PO TBDP: 4.0000 mg | ORAL_TABLET | Freq: Three times a day (TID) | ORAL | 0 refills | Status: AC | PRN

## 2024-02-10 MED ORDER — HYDROCODONE-ACETAMINOPHEN 5-325 MG PO TABS: 1.0000 | ORAL_TABLET | Freq: Four times a day (QID) | ORAL | 0 refills | Status: DC | PRN

## 2024-02-10 MED ORDER — ONDANSETRON HCL 4 MG/2ML IJ SOLN
4.0000 mg | Freq: Once | INTRAMUSCULAR | Status: AC
  Administered 2024-02-10: 4 mg via INTRAVENOUS
  Filled 2024-02-10: qty 2

## 2024-02-10 MED ORDER — IOHEXOL 300 MG/ML  SOLN
100.0000 mL | Freq: Once | INTRAMUSCULAR | Status: AC | PRN
  Administered 2024-02-10: 100 mL via INTRAVENOUS

## 2024-02-10 NOTE — ED Notes (Signed)
 Patient transported to CT

## 2024-02-10 NOTE — ED Provider Notes (Signed)
 [place] [facility] Provider Note   CSN: [id number] Arrival date & time: 02/10/24  1509     History  Chief Complaint  Patient presents with   Abdominal Pain    Victor Nixon is a 58 y.o. male with a history of kidney stones and hyperlipidemia who presents the ED today for abdominal pain.  Patient reports abdominal pain that began shortly after eating this afternoon.  Reports severe right sided abdominal pain with associated shaking, nausea, and vomiting.  Pain does not radiate to the back.  Was able to have normal bowel movements and passed flatus since onset of symptoms.  No fevers, dysuria, or hematuria.  Patient has history of kidney stone in the past but states that this feels different.  History of inguinal hernia repair but no other surgeries to the abdomen or pelvis.  No additional complaints or concerns at this time.    Home Medications Prior to Admission medications   Medication Sig Start Date End Date Taking? Authorizing Provider  HYDROcodone-acetaminophen (NORCO/VICODIN) 5-325 MG tablet Take 1 tablet by mouth every 6 (six) hours as needed for up to 3 days. 02/10/24 02/13/24 Yes Maxwell Marion, PA-C  ondansetron (ZOFRAN-ODT) 4 MG disintegrating tablet Take 1 tablet (4 mg total) by mouth every 8 (eight) hours as needed for nausea or vomiting. 02/10/24 03/11/24 Yes Maxwell Marion, PA-C  ibuprofen (ADVIL) 800 MG tablet Take 1 tablet (800 mg total) by mouth every 8 (eight) hours as needed (pain). 01/25/24   Zenia Resides, MD  Multiple Vitamin (MULTIVITAMIN) capsule Take 1 capsule by mouth daily.    [provider]  Omega-3 Fatty Acids (FISH OIL) 1200 MG CAPS Take 2,400 mg by mouth daily.    [provider]  omeprazole (PRILOSEC) 20 MG capsule Take 1 capsule (20 mg total) by mouth daily. 10/12/23   Unk Lightning, PA  rosuvastatin (CRESTOR) 10 MG tablet TAKE 1 TABLET DAILY 07/03/23   Georgina Quint, MD       Allergies    Cephalexin    Review of Systems   Review of Systems  Gastrointestinal:  Positive for abdominal pain.  All other systems reviewed and are negative.   Physical Exam Updated Vital Signs BP 125/70 (BP Location: Left Arm)   Pulse 73   Temp 98.2 F (36.8 C) (Oral)   Resp 19   Ht 5\' 11"  (1.803 m)   Wt 81.6 kg   SpO2 97%   BMI 25.10 kg/m  Physical Exam Vitals and nursing note reviewed.  Constitutional:      General: He is not in acute distress.    Appearance: Normal appearance.  HENT:     Head: Normocephalic and atraumatic.     Mouth/Throat:     Mouth: Mucous membranes are moist.  Eyes:     Conjunctiva/sclera: Conjunctivae normal.     Pupils: Pupils are equal, round, and reactive to light.  Cardiovascular:     Rate and Rhythm: Normal rate and regular rhythm.     Pulses: Normal pulses.     Heart sounds: Normal heart sounds.  Pulmonary:     Effort: Pulmonary effort is normal.     Breath sounds: Normal breath sounds.  Abdominal:     General: There is no distension.     Palpations: Abdomen is soft.     Tenderness: There is abdominal tenderness. There is no right CVA tenderness, left CVA tenderness, guarding or rebound.     Comments: Tenderness to  palpation of the right side of the abdomen without rebound.  Musculoskeletal:        General: Normal range of motion.     Cervical back: Normal range of motion.  Skin:    General: Skin is warm and dry.     Findings: No rash.  Neurological:     General: No focal deficit present.     Mental Status: He is alert.  Psychiatric:        Mood and Affect: Mood normal.        Behavior: Behavior normal.    ED Results / Procedures / Treatments   Labs (all labs ordered are listed, but only abnormal results are displayed) Labs Reviewed  COMPREHENSIVE METABOLIC PANEL - Abnormal; Notable for the following components:      Result Value   Potassium 3.4 (*)    Glucose, Bld 123 (*)    All other components within normal  limits  URINALYSIS, ROUTINE W REFLEX MICROSCOPIC - Abnormal; Notable for the following components:   Ketones, ur 15 (*)    All other components within normal limits  LIPASE, BLOOD  CBC WITH DIFFERENTIAL/PLATELET    EKG None  Radiology DG Abdomen 1 View Result Date: 02/10/2024 CLINICAL DATA:  Lower abdominal pain. EXAM: ABDOMEN - 1 VIEW COMPARISON:  CT abdomen pelvis from same day. Abdominal x-ray dated September 14, 2012. FINDINGS: Paucity of small bowel gas. Mildly distended stomach. Air and stool in the right colon. Excreted contrast in both renal collecting systems, ureters, and bladder. No acute osseous abnormality. IMPRESSION: 1. Nonspecific bowel gas pattern with paucity of small bowel gas. Electronically Signed   By: Obie Dredge M.D.   On: 02/10/2024 18:32   CT ABDOMEN PELVIS W CONTRAST Result Date: 02/10/2024 CLINICAL DATA:  Right lower quadrant abdominal pain. EXAM: CT ABDOMEN AND PELVIS WITH CONTRAST TECHNIQUE: Multidetector CT imaging of the abdomen and pelvis was performed using the standard protocol following bolus administration of intravenous contrast. RADIATION DOSE REDUCTION: This exam was performed according to the departmental dose-optimization program which includes automated exposure control, adjustment of the mA and/or kV according to patient size and/or use of iterative reconstruction technique. CONTRAST:  OMNIPAQUE IOHEXOL 300 MG/ML  SOLN COMPARISON:  CT abdomen pelvis dated 03/09/2020. FINDINGS: Lower chest: Minimal bibasilar subpleural atelectasis. The visualized lung bases are otherwise clear. No intra-abdominal free air or free fluid. Hepatobiliary: The liver is unremarkable. No biliary dilatation. The gallbladder is unremarkable Pancreas: Unremarkable. No pancreatic ductal dilatation or surrounding inflammatory changes. Spleen: Normal in size without focal abnormality. Adrenals/Urinary Tract: The adrenal glands unremarkable. There is no hydronephrosis on either  side. There is symmetric enhancement and excretion of contrast by both kidneys. The visualized ureters and urinary bladder appear unremarkable. Stomach/Bowel: There is mild dilatation of a short segment of small bowel in the anterior lower abdomen with fecalized content and mild edema of the associated mesentery. Findings may represent enteritis and ileus. Developing obstruction is not excluded. Small-bowel series may provide better evaluation. The colon is collapsed. The appendix is unremarkable. Vascular/Lymphatic: The abdominal aorta and IVC are unremarkable. No portal venous gas. There is no adenopathy. Reproductive: The prostate and seminal vesicles are grossly unremarkable. No pelvic mass. Other: None Musculoskeletal: No acute or significant osseous findings. IMPRESSION: 1. Short segment enteritis and ileus. Developing obstruction is not excluded. Small-bowel series may provide better evaluation. 2. Normal appendix. Electronically Signed   By: Elgie Collard M.D.   On: 02/10/2024 17:05    Procedures Procedures: not  indicated.   Medications Ordered in ED Medications  HYDROmorphone (DILAUDID) injection 1 mg (has no administration in time range)  ondansetron (ZOFRAN) injection 4 mg (has no administration in time range)  ondansetron (ZOFRAN) injection 4 mg (4 mg Intravenous Given 02/10/24 1550)  morphine (PF) 4 MG/ML injection 4 mg (4 mg Intravenous Given 02/10/24 1550)  iohexol (OMNIPAQUE) 300 MG/ML solution 100 mL (100 mLs Intravenous Contrast Given 02/10/24 1640)  sodium chloride 0.9 % bolus 1,000 mL (0 mLs Intravenous Stopped 02/10/24 1805)    ED Course/ Medical Decision Making/ A&P                                 Medical Decision Making Amount and/or Complexity of Data Reviewed Labs: ordered. Radiology: ordered.  Risk Prescription drug management.   This patient presents to the ED for concern of abdominal pain, this involves an extensive number of treatment options, and is a  complaint that carries with it a high risk of complications and morbidity.   Differential diagnosis includes: Gastroenteritis, gastritis, IBS, IBD, pancreatitis, cholecystitis, cholangitis, choledocholithiasis, bowel obstruction, perforation, diverticulitis, kidney stone, UTI, pyelonephritis, etc.   Comorbidities  See HPI above   Additional History  Additional history obtained from prior records   Lab Tests  I ordered and personally interpreted labs.  The pertinent results include:   CMP, lipase, and CBC are reassuring no acute electro derangement, AKI, transaminitis, infection, or anemia UA shows 15 ketones otherwise unremarkable   Imaging Studies  I ordered imaging studies including CT abdomen/pelvis with contrast and KUB  I independently visualized and interpreted imaging which showed:  CT shows a short segment of enteritis and ileus.  Developing obstruction is not excluded.  Small bowel series may provide better evaluation. Normal appendix. Abdomen x-ray shows non-specific bowel gas pattern with paucity of small bowel gas. I agree with the radiologist interpretation   Problem List / ED Course / Critical Interventions / Medication Management  Patient reports sharp right-sided abdominal pain that has been persistent since this afternoon associated shaking, nausea, and vomiting.  Patient has been will to have a bowel movement since onset of pain and is able to pass flatus.  Denies any fevers.  No pain or difficulty with urination.   I ordered morphine, Zofran, and fluids for patient.  Pain remains about the same with morphine.  I offered stronger pain medications and patient states that he will try to hold out as he does not like to take medication.  Order for Dilaudid and for patient if he would like it since morphine did not touch his pain.  On reevaluation he said that the morphine brought his pain down from a "9/10 to a 5/10." Patient's oxygen saturation was noted at 87%.  He  was on nasal cannula at the time of my evaluation with oxygen saturations of 100%.  He did not look to be in any acute respiratory distress.  Took off the supplemental oxygen and his oxygen saturations remained at 100%.  I believe the 87% oxygen saturation was a false read simply due to cold fingers. Patient staffed with my attending, Dr. Wallace Cullens. Discussed findings of CT and x-ray with patient and family were at bedside.  Discussed options of admission versus going home.  Patient would like to go home.  I will send prescriptions for nausea and pain to the pharmacy for patient to take.  He is instructed to return to the ED  if he does not have any bowel movement or passed flatus, his pain worsens, or he throws up every time he tries to eat.  He is agreeable with plan.   Social Determinants of Health  Access to healthcare   Test / Admission - Considered  Patient is stable and safe for discharge home. Strict return precautions given.       Final Clinical Impression(s) / ED Diagnoses Final diagnoses:  Enteritis  Ileus (HCC)    Rx / DC Orders ED Discharge Orders          Ordered    ondansetron (ZOFRAN-ODT) 4 MG disintegrating tablet  Every 8 hours PRN        02/10/24 1910    HYDROcodone-acetaminophen (NORCO/VICODIN) 5-325 MG tablet  Every 6 hours PRN        02/10/24 1910              Maxwell Marion, PA-C 02/10/24 1917    Franne Forts, DO 02/11/24 0008

## 2024-02-10 NOTE — Discharge Instructions (Addendum)
 As discussed, your CT shows enteritis and ileus with possible signs of a early bowel obstruction.  The x-ray cannot positively confirm bowel obstruction but did show a "paucity of small bowel gas."  I have sent a prescription of Vicodin to your pharmacy you can take this every 6 hours as needed for pain.  I have sent in a prescription of Zofran to the pharmacy, this is an under tongue dissolvable tablet that will help with nausea and vomiting.  You can take this every 8 hours as needed.  As we talked about, return to the ED if: You have severe bloating or pain in the abdomen. You vomit every time you eat or drink. You cannot pass gas or have a bowel movement.

## 2024-02-10 NOTE — ED Notes (Signed)
 Pt states he prefers to wait for prescribed pain and nausea meds at this time. PA aware.

## 2024-02-10 NOTE — ED Triage Notes (Signed)
 Lower abdominal pain, more on the right, onset 1 hour pta. Patient vomiting in triage. Pain less after vomiting.

## 2024-02-10 NOTE — ED Notes (Signed)
 RT Note: Patient was placed on 2lpm to increase oxygen saturation to 95%,  his oxygen saturation had dropped to 87% room air and was just given pain medication.

## 2024-02-11 ENCOUNTER — Encounter (HOSPITAL_COMMUNITY): Payer: Self-pay

## 2024-02-11 ENCOUNTER — Other Ambulatory Visit: Payer: Self-pay

## 2024-02-11 ENCOUNTER — Emergency Department (HOSPITAL_COMMUNITY)

## 2024-02-11 ENCOUNTER — Inpatient Hospital Stay (HOSPITAL_COMMUNITY)

## 2024-02-11 ENCOUNTER — Inpatient Hospital Stay (HOSPITAL_COMMUNITY)
Admission: EM | Admit: 2024-02-11 | Discharge: 2024-02-14 | DRG: 336 | Disposition: A | Discharge status: Home / Self Care | Attending: Internal Medicine | Admitting: Internal Medicine

## 2024-02-11 DIAGNOSIS — K5651 Intestinal adhesions [bands], with partial obstruction: Secondary | ICD-10-CM | POA: Diagnosis present

## 2024-02-11 DIAGNOSIS — E785 Hyperlipidemia, unspecified: Secondary | ICD-10-CM | POA: Diagnosis present

## 2024-02-11 DIAGNOSIS — K460 Unspecified abdominal hernia with obstruction, without gangrene: Secondary | ICD-10-CM | POA: Diagnosis present

## 2024-02-11 DIAGNOSIS — Z808 Family history of malignant neoplasm of other organs or systems: Secondary | ICD-10-CM

## 2024-02-11 DIAGNOSIS — Z83438 Family history of other disorder of lipoprotein metabolism and other lipidemia: Secondary | ICD-10-CM | POA: Diagnosis not present

## 2024-02-11 DIAGNOSIS — Z8249 Family history of ischemic heart disease and other diseases of the circulatory system: Secondary | ICD-10-CM

## 2024-02-11 DIAGNOSIS — Z881 Allergy status to other antibiotic agents status: Secondary | ICD-10-CM

## 2024-02-11 DIAGNOSIS — E876 Hypokalemia: Secondary | ICD-10-CM | POA: Diagnosis present

## 2024-02-11 DIAGNOSIS — K56609 Unspecified intestinal obstruction, unspecified as to partial versus complete obstruction: Secondary | ICD-10-CM | POA: Diagnosis present

## 2024-02-11 DIAGNOSIS — Z87442 Personal history of urinary calculi: Secondary | ICD-10-CM

## 2024-02-11 DIAGNOSIS — E7800 Pure hypercholesterolemia, unspecified: Secondary | ICD-10-CM | POA: Diagnosis not present

## 2024-02-11 LAB — CBC
HCT: 42.9 % (ref 39.0–52.0)
Hemoglobin: 14.1 g/dL (ref 13.0–17.0)
MCH: 30.3 pg (ref 26.0–34.0)
MCHC: 32.9 g/dL (ref 30.0–36.0)
MCV: 92.3 fL (ref 80.0–100.0)
Platelets: 335 10*3/uL (ref 150–400)
RBC: 4.65 MIL/uL (ref 4.22–5.81)
RDW: 12.9 % (ref 11.5–15.5)
WBC: 10.1 10*3/uL (ref 4.0–10.5)
nRBC: 0 % (ref 0.0–0.2)

## 2024-02-11 LAB — I-STAT CHEM 8, ED
BUN: 15 mg/dL (ref 6–20)
Calcium, Ion: 1.13 mmol/L — ABNORMAL LOW (ref 1.15–1.40)
Chloride: 103 mmol/L (ref 98–111)
Creatinine, Ser: 1 mg/dL (ref 0.61–1.24)
Glucose, Bld: 93 mg/dL (ref 70–99)
HCT: 40 % (ref 39.0–52.0)
Hemoglobin: 13.6 g/dL (ref 13.0–17.0)
Potassium: 3.9 mmol/L (ref 3.5–5.1)
Sodium: 139 mmol/L (ref 135–145)
TCO2: 25 mmol/L (ref 22–32)

## 2024-02-11 LAB — URINALYSIS, ROUTINE W REFLEX MICROSCOPIC
Bacteria, UA: NONE SEEN
Bilirubin Urine: NEGATIVE
Glucose, UA: NEGATIVE mg/dL
Hgb urine dipstick: NEGATIVE
Ketones, ur: 20 mg/dL — AB
Leukocytes,Ua: NEGATIVE
Nitrite: NEGATIVE
Protein, ur: 30 mg/dL — AB
Specific Gravity, Urine: 1.033 — ABNORMAL HIGH (ref 1.005–1.030)
pH: 5 (ref 5.0–8.0)

## 2024-02-11 LAB — COMPREHENSIVE METABOLIC PANEL
ALT: 23 U/L (ref 0–44)
AST: 23 U/L (ref 15–41)
Albumin: 4.1 g/dL (ref 3.5–5.0)
Alkaline Phosphatase: 51 U/L (ref 38–126)
Anion gap: 10 (ref 5–15)
BUN: 18 mg/dL (ref 6–20)
CO2: 26 mmol/L (ref 22–32)
Calcium: 9.2 mg/dL (ref 8.9–10.3)
Chloride: 102 mmol/L (ref 98–111)
Creatinine, Ser: 0.54 mg/dL — ABNORMAL LOW (ref 0.61–1.24)
GFR, Estimated: >60 mL/min (ref >60)
Glucose, Bld: 101 mg/dL — ABNORMAL HIGH (ref 70–99)
Potassium: 4.1 mmol/L (ref 3.5–5.1)
Sodium: 138 mmol/L (ref 135–145)
Total Bilirubin: 1 mg/dL (ref 0.0–1.2)
Total Protein: 7.2 g/dL (ref 6.5–8.1)

## 2024-02-11 LAB — I-STAT CG4 LACTIC ACID, ED: Lactic Acid, Venous: 1 mmol/L (ref 0.5–1.9)

## 2024-02-11 LAB — HIV ANTIBODY (ROUTINE TESTING W REFLEX): HIV Screen 4th Generation wRfx: NONREACTIVE

## 2024-02-11 LAB — LIPASE, BLOOD: Lipase: 30 U/L (ref 11–51)

## 2024-02-11 LAB — MAGNESIUM: Magnesium: 1.9 mg/dL (ref 1.7–2.4)

## 2024-02-11 MED ORDER — MORPHINE SULFATE (PF) 4 MG/ML IV SOLN
4.0000 mg | Freq: Once | INTRAVENOUS | Status: AC
  Administered 2024-02-11: 4 mg via INTRAVENOUS
  Filled 2024-02-11: qty 1

## 2024-02-11 MED ORDER — IOHEXOL 300 MG/ML  SOLN
100.0000 mL | Freq: Once | INTRAMUSCULAR | Status: AC | PRN
  Administered 2024-02-11: 80 mL via INTRAVENOUS

## 2024-02-11 MED ORDER — METHOCARBAMOL 1000 MG/10ML IJ SOLN
500.0000 mg | Freq: Four times a day (QID) | INTRAMUSCULAR | Status: DC | PRN
  Administered 2024-02-13: 500 mg via INTRAVENOUS
  Filled 2024-02-11: qty 10

## 2024-02-11 MED ORDER — ONDANSETRON HCL 4 MG/2ML IJ SOLN
4.0000 mg | Freq: Once | INTRAMUSCULAR | Status: AC
  Administered 2024-02-11: 4 mg via INTRAVENOUS
  Filled 2024-02-11: qty 2

## 2024-02-11 MED ORDER — DIATRIZOATE MEGLUMINE & SODIUM 66-10 % PO SOLN
90.0000 mL | Freq: Once | ORAL | Status: AC
  Administered 2024-02-11: 90 mL via NASOGASTRIC
  Filled 2024-02-11: qty 90

## 2024-02-11 MED ORDER — ENOXAPARIN SODIUM 40 MG/0.4ML IJ SOSY
40.0000 mg | PREFILLED_SYRINGE | INTRAMUSCULAR | Status: DC
  Administered 2024-02-11 – 2024-02-13 (×3): 40 mg via SUBCUTANEOUS
  Filled 2024-02-11 (×3): qty 0.4

## 2024-02-11 MED ORDER — MORPHINE SULFATE (PF) 2 MG/ML IV SOLN
2.0000 mg | INTRAVENOUS | Status: DC | PRN
  Administered 2024-02-11 – 2024-02-12 (×3): 2 mg via INTRAVENOUS
  Filled 2024-02-11 (×3): qty 1

## 2024-02-11 MED ORDER — ACETAMINOPHEN 325 MG PO TABS: 650.0000 mg | ORAL_TABLET | Freq: Four times a day (QID) | ORAL | Status: DC | PRN

## 2024-02-11 MED ORDER — ONDANSETRON HCL 4 MG/2ML IJ SOLN
4.0000 mg | Freq: Four times a day (QID) | INTRAMUSCULAR | Status: DC | PRN
  Administered 2024-02-12: 4 mg via INTRAVENOUS
  Filled 2024-02-11: qty 2

## 2024-02-11 MED ORDER — ACETAMINOPHEN 650 MG RE SUPP: 650.0000 mg | Freq: Four times a day (QID) | RECTAL | Status: DC | PRN

## 2024-02-11 MED ORDER — KCL IN DEXTROSE-NACL 20-5-0.9 MEQ/L-%-% IV SOLN
INTRAVENOUS | Status: AC
  Filled 2024-02-11 (×2): qty 1000

## 2024-02-11 MED ORDER — SODIUM CHLORIDE 0.9 % IV BOLUS
1000.0000 mL | Freq: Once | INTRAVENOUS | Status: AC
  Administered 2024-02-11: 1000 mL via INTRAVENOUS

## 2024-02-11 MED ORDER — ENOXAPARIN SODIUM 40 MG/0.4ML IJ SOSY: 40.0000 mg | PREFILLED_SYRINGE | INTRAMUSCULAR | Status: DC

## 2024-02-11 NOTE — H&P (Signed)
 History and Physical    Patient: Victor Nixon ZOX:[id number] DOB: 1966/10/20 DOA: 02/11/2024 DOS: the patient was seen and examined on 02/11/2024 PCP: Georgina Quint, MD  Patient coming from: Home  Chief Complaint:  Chief Complaint  Patient presents with   Abdominal Pain   HPI: Victor Nixon is a 58 y.o. male with medical history significant of seasonal allergies, heart murmur, hyperlipidemia, urolithiasis, history of hernia repair surgery who presented to the emergency department for the second day in a row due to abdominal pain associated with nausea, no emesis.  He felt better yesterday in the emergency department and went home.  However, his symptoms reexacerbated after he drank some coffee.  Currently not having flatus.  His last bowel movement was yesterday. No diarrhea, constipation, melena or hematochezia.  No flank pain, dysuria, frequency or hematuria. He denied fever, chills, rhinorrhea, sore throat, wheezing or hemoptysis.  No chest pain, palpitations, diaphoresis, PND, orthopnea or pitting edema of the lower extremities.   No polyuria, polydipsia, polyphagia or blurred vision.   Lab: Urinalysis showed Griese a specific gravity of 1.033, ketones of 20 and protein of 30 mg/dL.  The rest of the urinalysis results were normal.  Unremarkable CBC.  I-STAT Chem-8 was unremarkable except for an ionized calcium of 1.13 mmol/L.  Normal lactic acid.  CMP and lipase are still pending.  Imaging: CT abdomen/pelvis contrast showing persistent and progressive small bowel obstruction with findings concerning for closed-loop obstruction and possible developing bowel ischemia.   ED course: Initial vital signs were temperature 98.1 F, pulse 77, respirations 16, BP 131/79 mmHg O2 sat 98% on room air.  The patient received morphine 4 mg IVP, ondansetron 4 mg IVP and 1000 mL of normal saline bolus.  Review of Systems: As mentioned in the history of present illness. All other systems  reviewed and are negative. Past Medical History:  Diagnosis Date   Allergy    seasonal allergies   Broken arm (left arm) childhood   Heart murmur    Stable - not audible 5 yrs ago   History of kidney stones    Hyperlipidemia    Past Surgical History:  Procedure Laterality Date   COLONOSCOPY  2021   HERNIA REPAIR     age 52 or 8   INGUINAL HERNIA REPAIR Right 06/11/2022   Procedure: LAPAROSCOPIC RIGHT INGUINAL HERNIA;  Surgeon: Abigail Miyamoto, MD;  Location: [facility];  Service: General;  Laterality: Right;   NEPHROLITHOTOMY  06/21/2012   Procedure: NEPHROLITHOTOMY PERCUTANEOUS;  Surgeon: [name], MD;  Location: [facility];  Service: Urology;  Laterality: Left;  left ureteral stent   Social History:  reports that he has never smoked. He has never used smokeless tobacco. He reports current alcohol use of about 1.0 - 2.0 standard drink of alcohol per week. He reports that he does not use drugs.  Allergies  Allergen Reactions   Cephalexin Rash    Family History  Problem Relation Age of Onset   Hip fracture Mother    Cancer Father        melanoma   Heart disease Father        heart valve replacement   Hyperlipidemia Father    Kidney Stones Son    Colon cancer Neg Hx    Esophageal cancer Neg Hx    Rectal cancer Neg Hx    Stomach cancer Neg Hx    Colon polyps Neg Hx     Prior to Admission medications  Medication Sig Start Date End Date Taking? Authorizing Provider  HYDROcodone-acetaminophen (NORCO/VICODIN) 5-325 MG tablet Take 1 tablet by mouth every 6 (six) hours as needed for up to 3 days. 02/10/24 02/13/24 Yes Maxwell Marion, PA-C  Multiple Vitamin (MULTIVITAMIN) capsule Take 1 capsule by mouth at bedtime.   Yes [provider]  Omega-3 Fatty Acids (FISH OIL) 1200 MG CAPS Take 1,200 mg by mouth at bedtime.   Yes [provider]  ondansetron (ZOFRAN-ODT) 4 MG disintegrating tablet Take 1 tablet (4 mg total) by mouth every 8 (eight) hours as needed for  nausea or vomiting. 02/10/24 03/11/24 Yes Maxwell Marion, PA-C  rosuvastatin (CRESTOR) 10 MG tablet TAKE 1 TABLET DAILY 07/03/23  Yes Sagardia, Eilleen Kempf, MD  ibuprofen (ADVIL) 800 MG tablet Take 1 tablet (800 mg total) by mouth every 8 (eight) hours as needed (pain). Patient not taking: Reported on 02/11/2024 01/25/24   Zenia Resides, MD    Physical Exam: Vitals:   02/11/24 1000 02/11/24 1250 02/11/24 1251 02/11/24 1345  BP: 127/83 135/84  134/82  Pulse: 60 (!) 59  67  Resp: 16 16  16   Temp:   99.6 F (37.6 C)   TempSrc:   Oral   SpO2: 97% 95%  97%  Weight:      Height:       Physical Exam Vitals and nursing note reviewed.  Constitutional:      General: He is awake. He is not in acute distress.    Appearance: He is well-developed. He is ill-appearing.  HENT:     Head: Normocephalic.     Nose: No rhinorrhea.     Mouth/Throat:     Mouth: Mucous membranes are moist.  Eyes:     General: No scleral icterus.    Pupils: Pupils are equal, round, and reactive to light.  Neck:     Vascular: No JVD.  Cardiovascular:     Rate and Rhythm: Normal rate and regular rhythm.     Heart sounds: S1 normal and S2 normal.  Pulmonary:     Effort: Pulmonary effort is normal.     Breath sounds: Normal breath sounds. No wheezing, rhonchi or rales.  Abdominal:     General: Bowel sounds are decreased. There is no distension.     Palpations: Abdomen is soft.     Tenderness: There is abdominal tenderness in the epigastric area. There is no right CVA tenderness, left CVA tenderness, guarding or rebound.  Musculoskeletal:     Cervical back: Neck supple.     Right lower leg: No edema.     Left lower leg: No edema.  Skin:    General: Skin is warm and dry.  Neurological:     General: No focal deficit present.     Mental Status: He is alert and oriented to person, place, and time.  Psychiatric:        Mood and Affect: Mood normal.        Behavior: Behavior normal. Behavior is cooperative.      Data Reviewed:  Results are pending, will review when available.  Assessment and Plan: Principal Problem:   SBO (small bowel obstruction) (HCC) Inpatient/MedSurg. Keep NPO. Continue NTG at LIS. Continue IV fluids. Analgesics as needed. Antiemetics as needed. Pantoprazole 40 mg IVP every 24 hours. Keep electrolytes optimized. Follow-up CBC and CMP in AM. Follow-up imaging in the morning. General surgery input appreciated.  Active Problems:   HLD (hyperlipidemia) Hold rosuvastatin for now.    Advance Care  Planning:   Code Status: Full Code   Consults: [facility] Gaynelle Adu, MD).  Family Communication:   Severity of Illness: The appropriate patient status for this patient is INPATIENT. Inpatient status is judged to be reasonable and necessary in order to provide the required intensity of service to ensure the patient's safety. The patient's presenting symptoms, physical exam findings, and initial radiographic and laboratory data in the context of their chronic comorbidities is felt to place them at high risk for further clinical deterioration. Furthermore, it is not anticipated that the patient will be medically stable for discharge from the hospital within 2 midnights of admission.   * I certify that at the point of admission it is my clinical judgment that the patient will require inpatient hospital care spanning beyond 2 midnights from the point of admission due to high intensity of service, high risk for further deterioration and high frequency of surveillance required.*  Author: Bobette Mo, MD 02/11/2024 2:17 PM  For on call review [email or web address].   This document was prepared using Dragon voice recognition software and may contain some unintended transcription errors.

## 2024-02-11 NOTE — Consult Note (Signed)
 Consult Note  Victor Nixon [birth year]  [id number].    Requesting MD: Alvino Blood, MD Chief Complaint/Reason for Consult: SBO HPI:  Patient is a 58 year old male with PMH significant for HLD and nephrolithiasis who presented to the ED yesterday with abdominal pain. Pain started shortly after eating yesterday and was reported as severe generalized abdominal pain that did not radiate. He reported associated shaking, nausea and vomiting. No flatus or bm since onset. Appeared to have mild dilatation of a short segment of small bowel in the anterior lower abdomen with fecalized content and mild edema of the associated mesentery on imaging yesterday and given the choice of admission for observation vs going home and patient elected to go home. Patient was able to tolerate some po after returning home but required zofran to prevent n/v. He reports increased pain and distention this am and came back to the ED for evaluation. Currently pain is in his central abdomen and improved since presentation. Previously he has had a hernia repair at age 56 or 22 and a laparoscopic RIH repair in 2023 by Dr. Magnus Ivan. He is not on any blood thinners. Allergy to cephalexin. He reports occasional alcohol use and denies illicit drug or tobacco use.   ROS: As above, see hpi  Family History  Problem Relation Age of Onset   Hip fracture Mother    Cancer Father        melanoma   Heart disease Father        heart valve replacement   Hyperlipidemia Father    Kidney Stones Son    Colon cancer Neg Hx    Esophageal cancer Neg Hx    Rectal cancer Neg Hx    Stomach cancer Neg Hx    Colon polyps Neg Hx     Past Medical History:  Diagnosis Date   Allergy    seasonal allergies   Broken arm (left arm) childhood   Heart murmur    Stable - not audible 5 yrs ago   History of kidney stones    Hyperlipidemia     Past Surgical History:  Procedure Laterality Date   COLONOSCOPY  2021   HERNIA REPAIR      age 77 or 8   INGUINAL HERNIA REPAIR Right 06/11/2022   Procedure: LAPAROSCOPIC RIGHT INGUINAL HERNIA;  Surgeon: Abigail Miyamoto, MD;  Location: [facility];  Service: General;  Laterality: Right;   NEPHROLITHOTOMY  06/21/2012   Procedure: NEPHROLITHOTOMY PERCUTANEOUS;  Surgeon: [name], MD;  Location: [facility];  Service: Urology;  Laterality: Left;  left ureteral stent    Social History:  reports that he has never smoked. He has never used smokeless tobacco. He reports current alcohol use of about 1.0 - 2.0 standard drink of alcohol per week. He reports that he does not use drugs.  Allergies:  Allergies  Allergen Reactions   Cephalexin Rash    (Not in a hospital admission)   Blood pressure 127/83, pulse 60, temperature 98.1 F (36.7 C), temperature source Oral, resp. rate 16, height 5\' 11"  (1.803 m), weight 82 kg, SpO2 97%. Physical Exam:  General: pleasant, WD,  male who is laying in bed in NAD HEENT: head is normocephalic, atraumatic.  Sclera are noninjected.   Heart: regular, rate, and rhythm.   Lungs: CTAB, no wheezes, rhonchi, or rales noted.  Respiratory effort nonlabored Abd: At least mild distension but very soft with generalized ttp without rigidity or guarding. Hypoactive BS. No masses,  hernias, or organomegaly MS: No LE edema Skin: warm and dry  Neuro: Cranial nerves 2-12 grossly intact Psych: A&Ox3 with an appropriate affect.  Results for orders placed or performed during the hospital encounter of 02/11/24 (from the past 48 hours)  CBC     Status: None   Collection Time: 02/11/24 10:26 AM  Result Value Ref Range   WBC 10.1 4.0 - 10.5 K/uL   RBC 4.65 4.22 - 5.81 MIL/uL   Hemoglobin 14.1 13.0 - 17.0 g/dL   HCT 16.1 09.6 - 04.5 %   MCV 92.3 80.0 - 100.0 fL   MCH 30.3 26.0 - 34.0 pg   MCHC 32.9 30.0 - 36.0 g/dL   RDW 40.9 81.1 - 91.4 %   Platelets 335 150 - 400 K/uL   nRBC 0.0 0.0 - 0.2 %    Comment: Performed at [facility], [address], [place], [place] [address]  Urinalysis, Routine w reflex microscopic -Urine, Clean Catch     Status: Abnormal   Collection Time: 02/11/24 10:26 AM  Result Value Ref Range   Color, Urine YELLOW YELLOW   APPearance CLEAR CLEAR   Specific Gravity, Urine 1.033 (H) 1.005 - 1.030   pH 5.0 5.0 - 8.0   Glucose, UA NEGATIVE NEGATIVE mg/dL   Hgb urine dipstick NEGATIVE NEGATIVE   Bilirubin Urine NEGATIVE NEGATIVE   Ketones, ur 20 (A) NEGATIVE mg/dL   Protein, ur 30 (A) NEGATIVE mg/dL   Nitrite NEGATIVE NEGATIVE   Leukocytes,Ua NEGATIVE NEGATIVE   RBC / HPF 0-5 0 - 5 RBC/hpf   WBC, UA 0-5 0 - 5 WBC/hpf   Bacteria, UA NONE SEEN NONE SEEN   Squamous Epithelial / HPF 0-5 0 - 5 /HPF   Mucus PRESENT    Ca Oxalate Crys, UA PRESENT     Comment: Performed at [facility], [address] [place], [place] [address]   CT ABDOMEN PELVIS W CONTRAST Result Date: 02/11/2024 CLINICAL DATA:  Concern for bowel obstruction.  Abdominal pain. EXAM: CT ABDOMEN AND PELVIS WITH CONTRAST TECHNIQUE: Multidetector CT imaging of the abdomen and pelvis was performed using the standard protocol following bolus administration of intravenous contrast. RADIATION DOSE REDUCTION: This exam was performed according to the departmental dose-optimization program which includes automated exposure control, adjustment of the mA and/or kV according to patient size and/or use of iterative reconstruction technique. CONTRAST:  80mL OMNIPAQUE IOHEXOL 300 MG/ML  SOLN COMPARISON:  CT abdomen pelvis dated 02/10/2024. FINDINGS: Lower chest: Minimal bibasilar subpleural atelectasis. The visualized lung bases are otherwise clear. No intra-abdominal free air or free fluid. Hepatobiliary: The liver is unremarkable. No biliary dilatation. The cutis excretion of contrast in the gallbladder. No pericholecystic fluid. Pancreas: Unremarkable. No pancreatic ductal dilatation or surrounding inflammatory changes. Spleen: Normal in size  without focal abnormality. Adrenals/Urinary Tract: The adrenal glands unremarkable. There is no hydronephrosis on either side. There is symmetric enhancement and excretion of contrast by both kidneys. The visualized ureters and urinary bladder appear unremarkable. Stomach/Bowel: There is persistent and progressive dilatation of several small bowel loops in the mid abdomen measuring up to 4 cm in caliber. There appears to be two transition points (coronal 41 and 52 series 8). Findings concerning for a closed loop obstruction, possibly secondary to an internal hernia if there is history of prior surgery. Surgical consult is advised. There is edema and inflammatory changes in the associated mesentery which may be reactive or represent developing ischemia. No pneumatosis at this time.  The colon is collapsed.  The appendix is unremarkable. Vascular/Lymphatic: The abdominal aorta and IVC are unremarkable. Decreased opacification of a small bowel vein may be related to timing of the contrast or decreased bowel perfusion (45/2). No portal venous gas. There is no adenopathy. Reproductive: The prostate and seminal vesicles are grossly unremarkable. Other: None Musculoskeletal: No acute or significant osseous findings. IMPRESSION: Persistent and progressive small bowel obstruction with findings concerning for a closed loop obstruction and possible developing bowel ischemia. Surgical consult is advised. Electronically Signed   By: Elgie Collard M.D.   On: 02/11/2024 11:17   DG Abdomen 1 View Result Date: 02/10/2024 CLINICAL DATA:  Lower abdominal pain. EXAM: ABDOMEN - 1 VIEW COMPARISON:  CT abdomen pelvis from same day. Abdominal x-ray dated September 14, 2012. FINDINGS: Paucity of small bowel gas. Mildly distended stomach. Air and stool in the right colon. Excreted contrast in both renal collecting systems, ureters, and bladder. No acute osseous abnormality. IMPRESSION: 1. Nonspecific bowel gas pattern with paucity of  small bowel gas. Electronically Signed   By: Obie Dredge M.D.   On: 02/10/2024 18:32   CT ABDOMEN PELVIS W CONTRAST Result Date: 02/10/2024 CLINICAL DATA:  Right lower quadrant abdominal pain. EXAM: CT ABDOMEN AND PELVIS WITH CONTRAST TECHNIQUE: Multidetector CT imaging of the abdomen and pelvis was performed using the standard protocol following bolus administration of intravenous contrast. RADIATION DOSE REDUCTION: This exam was performed according to the departmental dose-optimization program which includes automated exposure control, adjustment of the mA and/or kV according to patient size and/or use of iterative reconstruction technique. CONTRAST:  OMNIPAQUE IOHEXOL 300 MG/ML  SOLN COMPARISON:  CT abdomen pelvis dated 03/09/2020. FINDINGS: Lower chest: Minimal bibasilar subpleural atelectasis. The visualized lung bases are otherwise clear. No intra-abdominal free air or free fluid. Hepatobiliary: The liver is unremarkable. No biliary dilatation. The gallbladder is unremarkable Pancreas: Unremarkable. No pancreatic ductal dilatation or surrounding inflammatory changes. Spleen: Normal in size without focal abnormality. Adrenals/Urinary Tract: The adrenal glands unremarkable. There is no hydronephrosis on either side. There is symmetric enhancement and excretion of contrast by both kidneys. The visualized ureters and urinary bladder appear unremarkable. Stomach/Bowel: There is mild dilatation of a short segment of small bowel in the anterior lower abdomen with fecalized content and mild edema of the associated mesentery. Findings may represent enteritis and ileus. Developing obstruction is not excluded. Small-bowel series may provide better evaluation. The colon is collapsed. The appendix is unremarkable. Vascular/Lymphatic: The abdominal aorta and IVC are unremarkable. No portal venous gas. There is no adenopathy. Reproductive: The prostate and seminal vesicles are grossly unremarkable. No pelvic  mass. Other: None Musculoskeletal: No acute or significant osseous findings. IMPRESSION: 1. Short segment enteritis and ileus. Developing obstruction is not excluded. Small-bowel series may provide better evaluation. 2. Normal appendix. Electronically Signed   By: Elgie Collard M.D.   On: 02/10/2024 17:05    Anti-infectives (From admission, onward)    None       Assessment/Plan SBO - CT 3/19 with mild dilatation of a short segment of small bowel in the anterior lower abdomen with fecalized content and mild edema of the associated mesentery  - CT today with persistent and progressive SBO with findings concerning for closed loop obstruction and possible developing bowel ischemia. Pt w/ hx of prior hernia repair as a child and laparoscopic RIH repair in 2023 - HDS without fever, tachycardia or hypotension. No peritonitis on exam. WBC wnl. Lactic wnl. Reviewed w/ attending. Will trial non-operative  management. No current indication for emergency surgery - Place NGT for decompression and keep NPO - Start SBO protocol - Keep K >=4, Phos >= 3, Mg >= 2 and mobilize for bowel function - Hopefully patient will improve with conservative management. If patient fails to improve with conservative management, they may require exploratory surgery during admission. Lower threshold for operative management if patient does not improve w/ conservative management given CT scan findings.  - Agree with medical admission. We will follow with you.  FEN: NPO, NGT to LIWS. IVF  VTE: SCDs, okay for chem ppx from a general surgery standpoint ID: no current abx  I reviewed ED provider notes, hospitalist notes, last 24 h vitals and pain scores, last 48 h intake and output, last 24 h labs and trends, and last 24 h imaging results.  Leary Roca, [facility] 02/11/2024, 12:45 PM Please see Amion for pager number during day hours 7:00am-4:30pm

## 2024-02-11 NOTE — ED Triage Notes (Signed)
 Patient presented to ER for abdominal pain, states she was seen yesterday at [facility] yesterday where he was told he possibly has a small bowel obstruction vs possible ileus. Patient was feeling better so was discarged, but states he has not had any gas or bowel movement since 1am which is not normal for him. Endorses abdominal pain and bloating.

## 2024-02-11 NOTE — Progress Notes (Signed)
 [facility] day shift admitting hospitalist team:  The patient will be admitted to our service due to small bowel obstruction.  However, chemistry lab work is pending and the patient was hypokalemic yesterday.  EDP notified.  Magnesium added as well.  They will call our service once labs are back. If K level is still low will admit to telemetry.  Sanda Klein MD.

## 2024-02-11 NOTE — ED Provider Notes (Signed)
 [place] EMERGENCY DEPARTMENT AT [facility] Provider Note  CSN: [id number] Arrival date & time: 02/11/24 2130  Chief Complaint(s) Abdominal Pain  HPI Victor Nixon is a 58 y.o. male history of hyperlipidemia, hernia surgery presenting to the emergency department with abdominal pain.  He reports abdominal pain present for a few days.  Went to outside ER yesterday, had CT scan which suggested enteritis versus early obstruction, was offered admission but ultimately felt well enough to go home and monitor symptoms.  Symptoms returned this morning and were severe so came back to the emergency department.  Reports nausea, no vomiting.  Has not passed any gas or had bowel movement since yesterday.  Did drink some coffee, seem to make symptoms worse.  No chest pain, shortness of breath, fevers, chills.   Past Medical History Past Medical History:  Diagnosis Date   Allergy    seasonal allergies   Broken arm (left arm) childhood   Heart murmur    Stable - not audible 5 yrs ago   History of kidney stones    Hyperlipidemia    Patient Active Problem List   Diagnosis Date Noted   SBO (small bowel obstruction) (HCC) 02/11/2024   HLD (hyperlipidemia) 04/24/2022   Family history of melanoma 02/18/2016   Home Medication(s) Prior to Admission medications   Medication Sig Start Date End Date Taking? Authorizing Provider  HYDROcodone-acetaminophen (NORCO/VICODIN) 5-325 MG tablet Take 1 tablet by mouth every 6 (six) hours as needed for up to 3 days. 02/10/24 02/13/24 Yes Maxwell Marion, PA-C  Multiple Vitamin (MULTIVITAMIN) capsule Take 1 capsule by mouth at bedtime.   Yes [provider]  Omega-3 Fatty Acids (FISH OIL) 1200 MG CAPS Take 1,200 mg by mouth at bedtime.   Yes [provider]  ondansetron (ZOFRAN-ODT) 4 MG disintegrating tablet Take 1 tablet (4 mg total) by mouth every 8 (eight) hours as needed for nausea or vomiting. 02/10/24 03/11/24 Yes Maxwell Marion, PA-C   rosuvastatin (CRESTOR) 10 MG tablet TAKE 1 TABLET DAILY 07/03/23  Yes Sagardia, Eilleen Kempf, MD  ibuprofen (ADVIL) 800 MG tablet Take 1 tablet (800 mg total) by mouth every 8 (eight) hours as needed (pain). Patient not taking: Reported on 02/11/2024 01/25/24   Zenia Resides, MD                                                                                                                                    Past Surgical History Past Surgical History:  Procedure Laterality Date   COLONOSCOPY  2021   HERNIA REPAIR     age 86 or 8   INGUINAL HERNIA REPAIR Right 06/11/2022   Procedure: LAPAROSCOPIC RIGHT INGUINAL HERNIA;  Surgeon: Abigail Miyamoto, MD;  Location: [facility];  Service: General;  Laterality: Right;   NEPHROLITHOTOMY  06/21/2012   Procedure: NEPHROLITHOTOMY PERCUTANEOUS;  Surgeon: [name], MD;  Location: [facility];  Service: Urology;  Laterality: Left;  left ureteral stent   Family History Family History  Problem Relation Age of Onset   Hip fracture Mother    Cancer Father        melanoma   Heart disease Father        heart valve replacement   Hyperlipidemia Father    Kidney Stones Son    Colon cancer Neg Hx    Esophageal cancer Neg Hx    Rectal cancer Neg Hx    Stomach cancer Neg Hx    Colon polyps Neg Hx     Social History Social History   Tobacco Use   Smoking status: Never   Smokeless tobacco: Never  Vaping Use   Vaping status: Never Used  Substance Use Topics   Alcohol use: Yes    Alcohol/week: 1.0 - 2.0 standard drink of alcohol    Types: 1 - 2 Glasses of wine per week    Comment: occassional beer, liquor   Drug use: No   Allergies Cephalexin  Review of Systems Review of Systems  All other systems reviewed and are negative.   Physical Exam Vital Signs  I have reviewed the triage vital signs BP 134/82   Pulse 67   Temp 99.6 F (37.6 C) (Oral)   Resp 16   Ht 5\' 11"  (1.803 m)   Wt 82 kg   SpO2 97%   BMI 25.21 kg/m  Physical  Exam Vitals and nursing note reviewed.  Constitutional:      General: He is not in acute distress.    Appearance: Normal appearance.  HENT:     Mouth/Throat:     Mouth: Mucous membranes are moist.  Eyes:     Conjunctiva/sclera: Conjunctivae normal.  Cardiovascular:     Rate and Rhythm: Normal rate and regular rhythm.  Pulmonary:     Effort: Pulmonary effort is normal. No respiratory distress.     Breath sounds: Normal breath sounds.  Abdominal:     General: Abdomen is flat.     Palpations: Abdomen is soft.     Tenderness: There is generalized abdominal tenderness.  Musculoskeletal:     Right lower leg: No edema.     Left lower leg: No edema.  Skin:    General: Skin is warm and dry.     Capillary Refill: Capillary refill takes less than 2 seconds.  Neurological:     Mental Status: He is alert and oriented to person, place, and time. Mental status is at baseline.  Psychiatric:        Mood and Affect: Mood normal.        Behavior: Behavior normal.     ED Results and Treatments Labs (all labs ordered are listed, but only abnormal results are displayed) Labs Reviewed  URINALYSIS, ROUTINE W REFLEX MICROSCOPIC - Abnormal; Notable for the following components:      Result Value   Specific Gravity, Urine 1.033 (*)    Ketones, ur 20 (*)    Protein, ur 30 (*)    All other components within normal limits  I-STAT CHEM 8, ED - Abnormal; Notable for the following components:   Calcium, Ion 1.13 (*)    All other components within normal limits  CBC  LIPASE, BLOOD  COMPREHENSIVE METABOLIC PANEL  MAGNESIUM  I-STAT CG4 LACTIC ACID, ED  Radiology CT ABDOMEN PELVIS W CONTRAST Result Date: 02/11/2024 CLINICAL DATA:  Concern for bowel obstruction.  Abdominal pain. EXAM: CT ABDOMEN AND PELVIS WITH CONTRAST TECHNIQUE: Multidetector CT imaging of the abdomen and pelvis  was performed using the standard protocol following bolus administration of intravenous contrast. RADIATION DOSE REDUCTION: This exam was performed according to the departmental dose-optimization program which includes automated exposure control, adjustment of the mA and/or kV according to patient size and/or use of iterative reconstruction technique. CONTRAST:  80mL OMNIPAQUE IOHEXOL 300 MG/ML  SOLN COMPARISON:  CT abdomen pelvis dated 02/10/2024. FINDINGS: Lower chest: Minimal bibasilar subpleural atelectasis. The visualized lung bases are otherwise clear. No intra-abdominal free air or free fluid. Hepatobiliary: The liver is unremarkable. No biliary dilatation. The cutis excretion of contrast in the gallbladder. No pericholecystic fluid. Pancreas: Unremarkable. No pancreatic ductal dilatation or surrounding inflammatory changes. Spleen: Normal in size without focal abnormality. Adrenals/Urinary Tract: The adrenal glands unremarkable. There is no hydronephrosis on either side. There is symmetric enhancement and excretion of contrast by both kidneys. The visualized ureters and urinary bladder appear unremarkable. Stomach/Bowel: There is persistent and progressive dilatation of several small bowel loops in the mid abdomen measuring up to 4 cm in caliber. There appears to be two transition points (coronal 41 and 52 series 8). Findings concerning for a closed loop obstruction, possibly secondary to an internal hernia if there is history of prior surgery. Surgical consult is advised. There is edema and inflammatory changes in the associated mesentery which may be reactive or represent developing ischemia. No pneumatosis at this time. The colon is collapsed.  The appendix is unremarkable. Vascular/Lymphatic: The abdominal aorta and IVC are unremarkable. Decreased opacification of a small bowel vein may be related to timing of the contrast or decreased bowel perfusion (45/2). No portal venous gas. There is no adenopathy.  Reproductive: The prostate and seminal vesicles are grossly unremarkable. Other: None Musculoskeletal: No acute or significant osseous findings. IMPRESSION: Persistent and progressive small bowel obstruction with findings concerning for a closed loop obstruction and possible developing bowel ischemia. Surgical consult is advised. Electronically Signed   By: Elgie Collard M.D.   On: 02/11/2024 11:17   DG Abdomen 1 View Result Date: 02/10/2024 CLINICAL DATA:  Lower abdominal pain. EXAM: ABDOMEN - 1 VIEW COMPARISON:  CT abdomen pelvis from same day. Abdominal x-ray dated September 14, 2012. FINDINGS: Paucity of small bowel gas. Mildly distended stomach. Air and stool in the right colon. Excreted contrast in both renal collecting systems, ureters, and bladder. No acute osseous abnormality. IMPRESSION: 1. Nonspecific bowel gas pattern with paucity of small bowel gas. Electronically Signed   By: Obie Dredge M.D.   On: 02/10/2024 18:32   CT ABDOMEN PELVIS W CONTRAST Result Date: 02/10/2024 CLINICAL DATA:  Right lower quadrant abdominal pain. EXAM: CT ABDOMEN AND PELVIS WITH CONTRAST TECHNIQUE: Multidetector CT imaging of the abdomen and pelvis was performed using the standard protocol following bolus administration of intravenous contrast. RADIATION DOSE REDUCTION: This exam was performed according to the departmental dose-optimization program which includes automated exposure control, adjustment of the mA and/or kV according to patient size and/or use of iterative reconstruction technique. CONTRAST:  OMNIPAQUE IOHEXOL 300 MG/ML  SOLN COMPARISON:  CT abdomen pelvis dated 03/09/2020. FINDINGS: Lower chest: Minimal bibasilar subpleural atelectasis. The visualized lung bases are otherwise clear. No intra-abdominal free air or free fluid. Hepatobiliary: The liver is unremarkable. No biliary dilatation. The gallbladder is unremarkable Pancreas: Unremarkable. No pancreatic ductal dilatation or surrounding  inflammatory changes. Spleen: Normal in size without focal abnormality. Adrenals/Urinary Tract: The adrenal glands unremarkable. There is no hydronephrosis on either side. There is symmetric enhancement and excretion of contrast by both kidneys. The visualized ureters and urinary bladder appear unremarkable. Stomach/Bowel: There is mild dilatation of a short segment of small bowel in the anterior lower abdomen with fecalized content and mild edema of the associated mesentery. Findings may represent enteritis and ileus. Developing obstruction is not excluded. Small-bowel series may provide better evaluation. The colon is collapsed. The appendix is unremarkable. Vascular/Lymphatic: The abdominal aorta and IVC are unremarkable. No portal venous gas. There is no adenopathy. Reproductive: The prostate and seminal vesicles are grossly unremarkable. No pelvic mass. Other: None Musculoskeletal: No acute or significant osseous findings. IMPRESSION: 1. Short segment enteritis and ileus. Developing obstruction is not excluded. Small-bowel series may provide better evaluation. 2. Normal appendix. Electronically Signed   By: Elgie Collard M.D.   On: 02/10/2024 17:05    Pertinent labs & imaging results that were available during my care of the patient were reviewed by me and considered in my medical decision making (see MDM for details).  Medications Ordered in ED Medications  diatrizoate meglumine-sodium (GASTROGRAFIN) 66-10 % solution 90 mL (has no administration in time range)  ondansetron (ZOFRAN) injection 4 mg (4 mg Intravenous Given 02/11/24 1022)  morphine (PF) 4 MG/ML injection 4 mg (4 mg Intravenous Given 02/11/24 1023)  sodium chloride 0.9 % bolus 1,000 mL (0 mLs Intravenous Stopped 02/11/24 1130)  iohexol (OMNIPAQUE) 300 MG/ML solution 100 mL (80 mLs Intravenous Contrast Given 02/11/24 1032)                                                                                                                                      Procedures Procedures  (including critical care time)  Medical Decision Making / ED Course   MDM:  58 year old presenting to the emergency department with abdominal pain.  Patient overall well-appearing, vital signs reassuring.  Does have abdominal tenderness diffusely on exam, no focal tenderness or peritoneal signs.  Reviewed CT scan report from yesterday, demonstrates possible early obstruction versus enteritis/ileus.  Given worsening symptoms, will repeat CT scan to assess for interval change such as obstruction, perforation, volvulus, diverticulitis, nephrolithiasis, or other acute changes.  If stable and continues to show possible signs of early obstruction patient probably need admission for observation  Clinical Course as of 02/11/24 1519  Thu Feb 11, 2024  1153 Discussed with Casimiro Needle, [place] general surgery they will consult, requests hospitalist admission  [WS]  1516 Pt has been admitted by Dr. Robb Matar  [WS]    Clinical Course User Index [WS] Suezanne Jacquet Jerilee Field, MD     Additional history obtained: -Additional history obtained from spouse -External records from outside source obtained and reviewed including: Chart review including previous notes, labs, imaging, consultation notes including ER visit yday   Lab Tests: -I ordered,  reviewed, and interpreted labs.   The pertinent results include:   Labs Reviewed  URINALYSIS, ROUTINE W REFLEX MICROSCOPIC - Abnormal; Notable for the following components:      Result Value   Specific Gravity, Urine 1.033 (*)    Ketones, ur 20 (*)    Protein, ur 30 (*)    All other components within normal limits  I-STAT CHEM 8, ED - Abnormal; Notable for the following components:   Calcium, Ion 1.13 (*)    All other components within normal limits  CBC  LIPASE, BLOOD  COMPREHENSIVE METABOLIC PANEL  MAGNESIUM  I-STAT CG4 LACTIC ACID, ED    Notable for slight ketonuria   Imaging Studies ordered: I ordered imaging studies  including CT abd On my interpretation imaging demonstrates SBO I independently visualized and interpreted imaging. I agree with the radiologist interpretation   Medicines ordered and prescription drug management: Meds ordered this encounter  Medications   ondansetron (ZOFRAN) injection 4 mg   morphine (PF) 4 MG/ML injection 4 mg   sodium chloride 0.9 % bolus 1,000 mL   iohexol (OMNIPAQUE) 300 MG/ML solution 100 mL   diatrizoate meglumine-sodium (GASTROGRAFIN) 66-10 % solution 90 mL    -I have reviewed the patients home medicines and have made adjustments as needed   Consultations Obtained: I requested consultation with the general surgeon,  and discussed lab and imaging findings as well as pertinent plan - they recommend: admission   Cardiac Monitoring: The patient was maintained on a cardiac monitor.  I personally viewed and interpreted the cardiac monitored which showed an underlying rhythm of: NSR  Reevaluation: After the interventions noted above, I reevaluated the patient and found that their symptoms have improved  Co morbidities that complicate the patient evaluation  Past Medical History:  Diagnosis Date   Allergy    seasonal allergies   Broken arm (left arm) childhood   Heart murmur    Stable - not audible 5 yrs ago   History of kidney stones    Hyperlipidemia       Dispostion: Disposition decision including need for hospitalization was considered, and patient admitted to the hospital.    Final Clinical Impression(s) / ED Diagnoses Final diagnoses:  SBO (small bowel obstruction) (HCC)     This chart was dictated using voice recognition software.  Despite best efforts to proofread,  errors can occur which can change the documentation meaning.    Lonell Grandchild, MD 02/11/24 [phone]

## 2024-02-11 NOTE — ED Notes (Signed)
 300 cc out put from NG tube.  Stopped suction. And pt moved to room.

## 2024-02-11 NOTE — ED Notes (Signed)
 Re-Order xray/accidentally clicked off order as complete

## 2024-02-12 ENCOUNTER — Encounter (HOSPITAL_COMMUNITY): Payer: Self-pay | Admitting: Internal Medicine

## 2024-02-12 ENCOUNTER — Inpatient Hospital Stay (HOSPITAL_COMMUNITY)

## 2024-02-12 ENCOUNTER — Encounter (HOSPITAL_COMMUNITY)
Admission: EM | Disposition: A | Discharge status: Home / Self Care | Payer: Self-pay | Source: Home / Self Care | Attending: Family Medicine

## 2024-02-12 ENCOUNTER — Other Ambulatory Visit: Payer: Self-pay

## 2024-02-12 DIAGNOSIS — E7800 Pure hypercholesterolemia, unspecified: Secondary | ICD-10-CM | POA: Diagnosis not present

## 2024-02-12 DIAGNOSIS — K56609 Unspecified intestinal obstruction, unspecified as to partial versus complete obstruction: Secondary | ICD-10-CM

## 2024-02-12 HISTORY — PX: LAPAROSCOPY: SHX197

## 2024-02-12 HISTORY — PX: LAPAROSCOPIC LYSIS OF ADHESIONS: SHX5905

## 2024-02-12 LAB — CBC
HCT: 42.4 % (ref 39.0–52.0)
Hemoglobin: 13.8 g/dL (ref 13.0–17.0)
MCH: 30.2 pg (ref 26.0–34.0)
MCHC: 32.5 g/dL (ref 30.0–36.0)
MCV: 92.8 fL (ref 80.0–100.0)
Platelets: 287 10*3/uL (ref 150–400)
RBC: 4.57 MIL/uL (ref 4.22–5.81)
RDW: 12.9 % (ref 11.5–15.5)
WBC: 7.7 10*3/uL (ref 4.0–10.5)
nRBC: 0 % (ref 0.0–0.2)

## 2024-02-12 LAB — COMPREHENSIVE METABOLIC PANEL
ALT: 20 U/L (ref 0–44)
AST: 17 U/L (ref 15–41)
Albumin: 3.7 g/dL (ref 3.5–5.0)
Alkaline Phosphatase: 47 U/L (ref 38–126)
Anion gap: 6 (ref 5–15)
BUN: 14 mg/dL (ref 6–20)
CO2: 25 mmol/L (ref 22–32)
Calcium: 8.3 mg/dL — ABNORMAL LOW (ref 8.9–10.3)
Chloride: 106 mmol/L (ref 98–111)
Creatinine, Ser: 0.72 mg/dL (ref 0.61–1.24)
GFR, Estimated: >60 mL/min (ref >60)
Glucose, Bld: 118 mg/dL — ABNORMAL HIGH (ref 70–99)
Potassium: 4 mmol/L (ref 3.5–5.1)
Sodium: 137 mmol/L (ref 135–145)
Total Bilirubin: 1.1 mg/dL (ref 0.0–1.2)
Total Protein: 6.6 g/dL (ref 6.5–8.1)

## 2024-02-12 LAB — GLUCOSE, CAPILLARY
Glucose-Capillary: 126 mg/dL — ABNORMAL HIGH (ref 70–99)
Glucose-Capillary: 107 mg/dL — ABNORMAL HIGH (ref 70–99)

## 2024-02-12 LAB — MAGNESIUM: Magnesium: 1.9 mg/dL (ref 1.7–2.4)

## 2024-02-12 LAB — PHOSPHORUS: Phosphorus: 2.4 mg/dL — ABNORMAL LOW (ref 2.5–4.6)

## 2024-02-12 LAB — TYPE AND SCREEN
ABO/RH(D): O POS
Antibody Screen: NEGATIVE

## 2024-02-12 LAB — SURGICAL PCR SCREEN
MRSA, PCR: NEGATIVE
Staphylococcus aureus: NEGATIVE

## 2024-02-12 SURGERY — LAPAROSCOPY, DIAGNOSTIC
Anesthesia: General | Site: Abdomen

## 2024-02-12 MED ORDER — DEXTROSE IN LACTATED RINGERS 5 % IV SOLN: INTRAVENOUS | Status: DC

## 2024-02-12 MED ORDER — ACETAMINOPHEN 10 MG/ML IV SOLN
INTRAVENOUS | Status: AC
  Administered 2024-02-12: 1000 mg via INTRAVENOUS
  Filled 2024-02-12: qty 100

## 2024-02-12 MED ORDER — SUGAMMADEX SODIUM 200 MG/2ML IV SOLN
INTRAVENOUS | Status: DC | PRN
  Administered 2024-02-12: 200 mg via INTRAVENOUS

## 2024-02-12 MED ORDER — ONDANSETRON HCL 4 MG/2ML IJ SOLN
INTRAMUSCULAR | Status: DC | PRN
  Administered 2024-02-12: 4 mg via INTRAVENOUS

## 2024-02-12 MED ORDER — LIDOCAINE HCL (PF) 2 % IJ SOLN
INTRAMUSCULAR | Status: AC
  Filled 2024-02-12: qty 5

## 2024-02-12 MED ORDER — BUPIVACAINE-EPINEPHRINE 0.25% -1:200000 IJ SOLN
INTRAMUSCULAR | Status: DC | PRN
  Administered 2024-02-12: 30 mL

## 2024-02-12 MED ORDER — SUCCINYLCHOLINE CHLORIDE 200 MG/10ML IV SOSY
PREFILLED_SYRINGE | INTRAVENOUS | Status: DC | PRN
  Administered 2024-02-12: 100 mg via INTRAVENOUS

## 2024-02-12 MED ORDER — ROCURONIUM BROMIDE 10 MG/ML (PF) SYRINGE
PREFILLED_SYRINGE | INTRAVENOUS | Status: AC
  Filled 2024-02-12: qty 10

## 2024-02-12 MED ORDER — OXYCODONE HCL 5 MG PO TABS: 5.0000 mg | ORAL_TABLET | Freq: Once | ORAL | Status: DC | PRN

## 2024-02-12 MED ORDER — FENTANYL CITRATE PF 50 MCG/ML IJ SOSY: 25.0000 ug | PREFILLED_SYRINGE | INTRAMUSCULAR | Status: DC | PRN

## 2024-02-12 MED ORDER — ROCURONIUM BROMIDE 10 MG/ML (PF) SYRINGE
PREFILLED_SYRINGE | INTRAVENOUS | Status: DC | PRN
  Administered 2024-02-12: 40 mg via INTRAVENOUS
  Administered 2024-02-12: 20 mg via INTRAVENOUS

## 2024-02-12 MED ORDER — ACETAMINOPHEN 10 MG/ML IV SOLN: 1000.0000 mg | Freq: Once | INTRAVENOUS | Status: AC

## 2024-02-12 MED ORDER — SPY AGENT GREEN - (INDOCYANINE FOR INJECTION)
INTRAMUSCULAR | Status: DC | PRN
  Administered 2024-02-12: 7.5 mg via INTRAVENOUS

## 2024-02-12 MED ORDER — FENTANYL CITRATE (PF) 100 MCG/2ML IJ SOLN
INTRAMUSCULAR | Status: AC
  Filled 2024-02-12: qty 2

## 2024-02-12 MED ORDER — PROPOFOL 10 MG/ML IV BOLUS
INTRAVENOUS | Status: DC | PRN
Start: 2024-02-12 — End: 2024-02-12
  Administered 2024-02-12: 160 mg via INTRAVENOUS

## 2024-02-12 MED ORDER — BUPIVACAINE-EPINEPHRINE (PF) 0.5% -1:200000 IJ SOLN
INTRAMUSCULAR | Status: AC
  Filled 2024-02-12: qty 30

## 2024-02-12 MED ORDER — CHLORHEXIDINE GLUCONATE 0.12 % MT SOLN
15.0000 mL | Freq: Once | OROMUCOSAL | Status: AC
  Administered 2024-02-12: 15 mL via OROMUCOSAL

## 2024-02-12 MED ORDER — PHENOL 1.4 % MT LIQD
1.0000 | OROMUCOSAL | Status: DC | PRN
  Filled 2024-02-12: qty 177

## 2024-02-12 MED ORDER — SUGAMMADEX SODIUM 200 MG/2ML IV SOLN
INTRAVENOUS | Status: AC
  Filled 2024-02-12: qty 2

## 2024-02-12 MED ORDER — ACETAMINOPHEN 10 MG/ML IV SOLN
1000.0000 mg | Freq: Four times a day (QID) | INTRAVENOUS | Status: DC
  Administered 2024-02-12 (×2): 1000 mg via INTRAVENOUS
  Filled 2024-02-12 (×4): qty 100

## 2024-02-12 MED ORDER — 0.9 % SODIUM CHLORIDE (POUR BTL) OPTIME
TOPICAL | Status: DC | PRN
  Administered 2024-02-12: 2000 mL

## 2024-02-12 MED ORDER — OXYCODONE HCL 5 MG/5ML PO SOLN: 5.0000 mg | Freq: Once | ORAL | Status: DC | PRN

## 2024-02-12 MED ORDER — PROPOFOL 10 MG/ML IV BOLUS
INTRAVENOUS | Status: AC
  Filled 2024-02-12: qty 20

## 2024-02-12 MED ORDER — ORAL CARE MOUTH RINSE: 15.0000 mL | Freq: Once | OROMUCOSAL | Status: AC

## 2024-02-12 MED ORDER — MIDAZOLAM HCL 2 MG/2ML IJ SOLN
INTRAMUSCULAR | Status: AC
Start: 2024-02-12 — End: ?
  Filled 2024-02-12: qty 2

## 2024-02-12 MED ORDER — FENTANYL CITRATE (PF) 100 MCG/2ML IJ SOLN
INTRAMUSCULAR | Status: DC | PRN
  Administered 2024-02-12 (×2): 50 ug via INTRAVENOUS

## 2024-02-12 MED ORDER — ARTIFICIAL TEARS OPHTHALMIC OINT
TOPICAL_OINTMENT | OPHTHALMIC | Status: AC
  Filled 2024-02-12: qty 3.5

## 2024-02-12 MED ORDER — LIDOCAINE HCL (PF) 2 % IJ SOLN
INTRAMUSCULAR | Status: DC | PRN
  Administered 2024-02-12: 100 mg via INTRADERMAL

## 2024-02-12 MED ORDER — MIDAZOLAM HCL 2 MG/2ML IJ SOLN
INTRAMUSCULAR | Status: DC | PRN
Start: 2024-02-12 — End: 2024-02-12
  Administered 2024-02-12: 2 mg via INTRAVENOUS

## 2024-02-12 MED ORDER — MORPHINE SULFATE (PF) 2 MG/ML IV SOLN: 2.0000 mg | INTRAVENOUS | Status: DC | PRN

## 2024-02-12 MED ORDER — ONDANSETRON HCL 4 MG/2ML IJ SOLN
INTRAMUSCULAR | Status: AC
  Filled 2024-02-12: qty 2

## 2024-02-12 MED ORDER — DEXAMETHASONE SODIUM PHOSPHATE 10 MG/ML IJ SOLN
INTRAMUSCULAR | Status: DC | PRN
  Administered 2024-02-12: 8 mg via INTRAVENOUS

## 2024-02-12 MED ORDER — LACTATED RINGERS IV SOLN: INTRAVENOUS | Status: DC

## 2024-02-12 MED ORDER — CEFAZOLIN SODIUM-DEXTROSE 2-4 GM/100ML-% IV SOLN
2.0000 g | INTRAVENOUS | Status: AC
  Administered 2024-02-12: 2 g via INTRAVENOUS
  Filled 2024-02-12: qty 100

## 2024-02-12 MED ORDER — ACETAMINOPHEN 10 MG/ML IV SOLN: 1000.0000 mg | Freq: Once | INTRAVENOUS | Status: DC | PRN

## 2024-02-12 MED ORDER — DROPERIDOL 2.5 MG/ML IJ SOLN: 0.6250 mg | Freq: Once | INTRAMUSCULAR | Status: DC | PRN

## 2024-02-12 SURGICAL SUPPLY — 57 items
APPLIER CLIP 5 13 M/L LIGAMAX5 (MISCELLANEOUS) IMPLANT
APPLIER CLIP ROT 10 11.4 M/L (STAPLE) IMPLANT
BAG COUNTER SPONGE SURGICOUNT (BAG) IMPLANT
BLADE EXTENDED COATED 6.5IN (ELECTRODE) IMPLANT
BLADE SURG SZ10 CARB STEEL (BLADE) IMPLANT
CABLE HIGH FREQUENCY MONO STRZ (ELECTRODE) IMPLANT
CELLS DAT CNTRL 66122 CELL SVR (MISCELLANEOUS) IMPLANT
CHLORAPREP W/TINT 26 (MISCELLANEOUS) ×2 IMPLANT
CLIP APPLIE 5 13 M/L LIGAMAX5 (MISCELLANEOUS) IMPLANT
CLIP APPLIE ROT 10 11.4 M/L (STAPLE) IMPLANT
COVER MAYO STAND STRL (DRAPES) IMPLANT
COVER SURGICAL LIGHT HANDLE (MISCELLANEOUS) ×2 IMPLANT
DERMABOND ADVANCED .7 DNX12 (GAUZE/BANDAGES/DRESSINGS) IMPLANT
DRAPE SHEET LG 3/4 BI-LAMINATE (DRAPES) IMPLANT
DRAPE WARM FLUID 44X44 (DRAPES) IMPLANT
ELECT REM PT RETURN 15FT ADLT (MISCELLANEOUS) ×2 IMPLANT
GAUZE SPONGE 4X4 12PLY STRL (GAUZE/BANDAGES/DRESSINGS) ×2 IMPLANT
GLOVE BIO SURGEON STRL SZ7.5 (GLOVE) ×2 IMPLANT
GLOVE INDICATOR 8.0 STRL GRN (GLOVE) ×2 IMPLANT
GOWN STRL REUS W/ TWL XL LVL3 (GOWN DISPOSABLE) ×8 IMPLANT
GRASPER SUT TROCAR 14GX15 (MISCELLANEOUS) IMPLANT
HANDLE SUCTION POOLE (INSTRUMENTS) IMPLANT
IRRIG SUCT STRYKERFLOW 2 WTIP (MISCELLANEOUS) ×2 IMPLANT
IRRIGATION SUCT STRKRFLW 2 WTP (MISCELLANEOUS) IMPLANT
KIT BASIN OR (CUSTOM PROCEDURE TRAY) ×2 IMPLANT
KIT TURNOVER KIT A (KITS) IMPLANT
LEGGING LITHOTOMY PAIR STRL (DRAPES) IMPLANT
RETRACTOR WND ALEXIS 18 MED (MISCELLANEOUS) IMPLANT
RTRCTR WOUND ALEXIS 18CM MED (MISCELLANEOUS) IMPLANT
SCISSORS LAP 5X35 DISP (ENDOMECHANICALS) ×2 IMPLANT
SHEARS HARMONIC 36 ACE (MISCELLANEOUS) IMPLANT
SLEEVE ADV FIXATION 5X100MM (TROCAR) IMPLANT
SLEEVE Z-THREAD 5X100MM (TROCAR) ×2 IMPLANT
SPIKE FLUID TRANSFER (MISCELLANEOUS) ×2 IMPLANT
STAPLER SKIN PROX WIDE 3.9 (STAPLE) IMPLANT
STRIP CLOSURE SKIN 1/2X4 (GAUZE/BANDAGES/DRESSINGS) IMPLANT
SUCTION POOLE HANDLE (INSTRUMENTS) IMPLANT
SUT MNCRL AB 4-0 PS2 18 (SUTURE) IMPLANT
SUT PDS AB 1 TP1 96 (SUTURE) IMPLANT
SUT PROLENE 2 0 KS (SUTURE) IMPLANT
SUT PROLENE 2 0 SH DA (SUTURE) IMPLANT
SUT SILK 2 0 SH CR/8 (SUTURE) IMPLANT
SUT SILK 2-0 18XBRD TIE 12 (SUTURE) IMPLANT
SUT SILK 3 0 SH CR/8 (SUTURE) IMPLANT
SUT SILK 3-0 18XBRD TIE 12 (SUTURE) IMPLANT
SUT VICRYL 0 TIES 12 18 (SUTURE) IMPLANT
SUT VICRYL 0 UR6 27IN ABS (SUTURE) IMPLANT
SYR BULB IRRIG 60ML STRL (SYRINGE) IMPLANT
SYS LAPSCP GELPORT 120MM (MISCELLANEOUS) IMPLANT
SYSTEM LAPSCP GELPORT 120MM (MISCELLANEOUS) IMPLANT
TOWEL OR 17X26 10 PK STRL BLUE (TOWEL DISPOSABLE) ×2 IMPLANT
TRAY FOLEY MTR SLVR 16FR STAT (SET/KITS/TRAYS/PACK) ×2 IMPLANT
TRAY LAPAROSCOPIC (CUSTOM PROCEDURE TRAY) ×2 IMPLANT
TROCAR 11X100 Z THREAD (TROCAR) IMPLANT
TROCAR ADV FIXATION 5X100MM (TROCAR) IMPLANT
TROCAR Z-THREAD OPTICAL 5X100M (TROCAR) ×2 IMPLANT
YANKAUER SUCT BULB TIP NO VENT (SUCTIONS) IMPLANT

## 2024-02-12 NOTE — Anesthesia Postprocedure Evaluation (Signed)
 Anesthesia Post Note  Patient: Victor Nixon  Procedure(s) Performed: LAPAROSCOPY, DIAGNOSTIC, ASSESSMENT OF PERFUSION USING ICG LYSIS, ADHESIONS, LAPAROSCOPIC (Abdomen)     Patient location during evaluation: PACU Anesthesia Type: General Level of consciousness: awake and alert Pain management: pain level controlled Vital Signs Assessment: post-procedure vital signs reviewed and stable Respiratory status: spontaneous breathing, nonlabored ventilation, respiratory function stable and patient connected to nasal cannula oxygen Cardiovascular status: blood pressure returned to baseline and stable Postop Assessment: no apparent nausea or vomiting Anesthetic complications: no   No notable events documented.  Last Vitals:  Vitals:   02/12/24 1145 02/12/24 1423  BP: 125/77 130/82  Pulse: 66 68  Resp: 13   Temp:  37.1 C  SpO2: 93% 95%    Last Pain:  Vitals:   02/12/24 1426  TempSrc:   PainSc: 4                  [place] [place]

## 2024-02-12 NOTE — Anesthesia Preprocedure Evaluation (Signed)
 Anesthesia Evaluation  Patient identified by MRN, date of birth, ID band Patient awake    Reviewed: Allergy & Precautions, H&P , NPO status , Patient's Chart, lab work & pertinent test results  Airway Mallampati: II  TM Distance: >3 FB Neck ROM: full    Dental no notable dental hx. (+) Dental Advisory Given   Pulmonary neg pulmonary ROS   Pulmonary exam normal breath sounds clear to auscultation       Cardiovascular Exercise Tolerance: Good + Valvular Problems/Murmurs  Rhythm:regular Rate:Normal  HLD   Neuro/Psych negative neurological ROS  negative psych ROS   GI/Hepatic Neg liver ROS,,,SMALL BOWEL OBSTRUCTION   Endo/Other  negative endocrine ROS    Renal/GU negative Renal ROS  negative genitourinary   Musculoskeletal   Abdominal   Peds  Hematology negative hematology ROS (+)   Anesthesia Other Findings   Reproductive/Obstetrics negative OB ROS                              Anesthesia Physical Anesthesia Plan  ASA: 2  Anesthesia Plan: General   Post-op Pain Management: Tylenol PO (pre-op)*   Induction: Intravenous  PONV Risk Score and Plan: 4 or greater and Ondansetron, Dexamethasone, Treatment may vary due to age or medical condition and Midazolam  Airway Management Planned: Oral ETT  Additional Equipment:   Intra-op Plan:   Post-operative Plan: Extubation in OR  Informed Consent: I have reviewed the patients History and Physical, chart, labs and discussed the procedure including the risks, benefits and alternatives for the proposed anesthesia with the patient or authorized representative who has indicated his/her understanding and acceptance.     Dental advisory given  Plan Discussed with: CRNA  Anesthesia Plan Comments:          Anesthesia Quick Evaluation

## 2024-02-12 NOTE — Anesthesia Procedure Notes (Signed)
 Procedure Name: Intubation Date/Time: 02/12/2024 9:17 AM  Performed by: Cleda Clarks, CRNAPre-anesthesia Checklist: Patient identified, Emergency Drugs available, Suction available and Patient being monitored Patient Re-evaluated:Patient Re-evaluated prior to induction Oxygen Delivery Method: Circle system utilized Preoxygenation: Pre-oxygenation with 100% oxygen Induction Type: IV induction and Rapid sequence Ventilation: Mask ventilation without difficulty Laryngoscope Size: Miller and 2 Grade View: Grade II Tube type: Oral Tube size: 7.5 mm Number of attempts: 1 Airway Equipment and Method: Stylet Placement Confirmation: ETT inserted through vocal cords under direct vision, positive ETCO2 and breath sounds checked- equal and bilateral Secured at: 23 cm Tube secured with: Tape Dental Injury: Teeth and Oropharynx as per pre-operative assessment  Comments: NG tube to sxn  prior to induction.

## 2024-02-12 NOTE — Transfer of Care (Signed)
 Immediate Anesthesia Transfer of Care Note  Patient: Victor Nixon  Procedure(s) Performed: LAPAROSCOPY, DIAGNOSTIC, ASSESSMENT OF PERFUSION USING ICG LYSIS, ADHESIONS, LAPAROSCOPIC (Abdomen)  Patient Location: PACU  Anesthesia Type:General  Level of Consciousness: awake, alert , and oriented  Airway & Oxygen Therapy: Patient Spontanous Breathing and Patient connected to face mask oxygen  Post-op Assessment: Report given to RN and Post -op Vital signs reviewed and stable  Post vital signs: Reviewed and stable  Last Vitals:  Vitals Value Taken Time  BP 133/89 02/12/24 1045  Temp    Pulse 98 02/12/24 1046  Resp 14 02/12/24 1046  SpO2 95 % 02/12/24 1046  Vitals shown include unfiled device data.  Last Pain:  Vitals:   02/12/24 0836  TempSrc:   PainSc: 6       Patients Stated Pain Goal: 4 (02/12/24 0836)  Complications: No notable events documented.

## 2024-02-12 NOTE — Progress Notes (Signed)
 [facility]  PROGRESS NOTE  Victor Nixon YQM:[id number] DOB: 17-Feb-1966 DOA: 02/11/2024 PCP: Georgina Quint, MD   Brief HPI:   58 y.o. male with medical history significant of seasonal allergies, heart murmur, hyperlipidemia, urolithiasis, history of hernia repair surgery who presented to the emergency department for the second day in a row due to abdominal pain associated with nausea, no emesis.  He felt better yesterday in the emergency department and went home.  However, his symptoms reexacerbated after he drank some coffee.  Currently not having flatus.  His last bowel movement was yesterday. No diarrhea, constipation, melena or hematochezia.  No flank pain, dysuria, frequency or hematuria. He denied fever, chills, rhinorrhea, sore throat, wheezing or hemoptysis  CT abdomen/pelvis contrast showing persistent and progressive small bowel obstruction with findings concerning for closed-loop obstruction and possible developing bowel ischemia.     Assessment/Plan:   SBO (small bowel obstruction) (HCC) -Status post lysis of adhesions -General Surgery following, NG tube in place to wall suction -Management per general surgery  Hyperlipidemia -Hold rosuvastatin for now    Medications     enoxaparin (LOVENOX) injection  40 mg Subcutaneous Q24H     Data Reviewed:   CBG:  No results for input(s): "GLUCAP" in the last 168 hours.  SpO2: 93 % O2 Flow Rate (L/min): 5 L/min    Vitals:   02/12/24 1100 02/12/24 1115 02/12/24 1130 02/12/24 1145  BP: (!) 142/76 134/79 129/78 125/77  Pulse: 66 71 67 66  Resp: 12 10 11 13   Temp:      TempSrc:      SpO2: 100% 96% 94% 93%  Weight:      Height:          Data Reviewed:  Basic Metabolic Panel: Recent Labs  Lab 02/10/24 1520 02/11/24 1026 02/11/24 1210 02/11/24 1806 02/12/24 0401  NA 138 138 139  --  137  K 3.4* 4.1 3.9  --  4.0  CL 100 102 103  --  106  CO2 24 26  --   --  25  GLUCOSE 123* 101* 93  --   118*  BUN 16 18 15   --  14  CREATININE 0.89 0.54* 1.00  --  0.72  CALCIUM 9.1 9.2  --   --  8.3*  MG  --   --   --  1.9 1.9  PHOS  --   --   --   --  2.4*    CBC: Recent Labs  Lab 02/10/24 1520 02/11/24 1026 02/11/24 1210 02/12/24 0401  WBC 7.8 10.1  --  7.7  NEUTROABS 3.5  --   --   --   HGB 14.8 14.1 13.6 13.8  HCT 43.7 42.9 40.0 42.4  MCV 90.7 92.3  --  92.8  PLT 382 335  --  287    LFT Recent Labs  Lab 02/10/24 1520 02/11/24 1026 02/12/24 0401  AST 30 23 17   ALT 28 23 20   ALKPHOS 60 51 47  BILITOT 1.0 1.0 1.1  PROT 7.9 7.2 6.6  ALBUMIN 4.4 4.1 3.7     Antibiotics: Anti-infectives (From admission, onward)    Start     Dose/Rate Route Frequency Ordered Stop   02/12/24 0845  ceFAZolin (ANCEF) IVPB 2g/100 mL premix        2 g 200 mL/hr over 30 Minutes Intravenous On call 02/12/24 0758 02/12/24 1008        DVT prophylaxis: Lovenox  Code Status: Full code  Family Communication: Discussed with patient's wife at bedside   CONSULTS General Surgery   Subjective   Patient seen after surgery.   Objective    Physical Examination:   General-appears in no acute distress, NG tube in place Heart-S1-S2, regular, no murmur auscultated Lungs-clear to auscultation bilaterally, no wheezing or crackles auscultated Abdomen-soft, nontender, no organomegaly Extremities-no edema in the lower extremities Neuro-alert, oriented x3, no focal deficit noted   Status is: Inpatient:             Meredeth Ide   [facility] If 7PM-7AM, please contact night-coverage at [email or web address], Office  [phone]   02/12/2024, 12:55 PM  LOS: 1 day

## 2024-02-12 NOTE — Progress Notes (Signed)
 Patient ID: Victor Nixon, male   DOB: 09-04-1966, 58 y.o.   MRN: [id number]   Acute Care Surgery Service Progress Note:    Chief Complaint/Subjective: No flatus/no BM Vomited early this am Feels dehydrated Has HA - biggest c/o  Some gast pain  Objective: Vital signs in last 24 hours: Temp:  [98 F (36.7 C)-99.6 F (37.6 C)] 98 F (36.7 C) (03/21 0523) Pulse Rate:  [58-77] 59 (03/21 0523) Resp:  [16-18] 18 (03/21 0523) BP: (127-142)/(79-84) 132/83 (03/21 0523) SpO2:  [93 %-100 %] 93 % (03/21 0523) Weight:  [82 kg] 82 kg (03/20 0910) Last BM Date : 02/10/24  Intake/Output from previous day: 03/20 0701 - 03/21 0700 In: 2124.6 [I.V.:1124.6; IV Piggyback:1000] Out: 850 [Urine:550] Intake/Output this shift: No intake/output data recorded.  Lungs: cta, nonlabored  Cardiovascular: reg  Abd: soft, mild distension, mild TTP, - exam unchanged from yesterday  Extremities: no edema, +SCDs  Neuro: alert, nonfocal  Lab Results: CBC  Recent Labs    02/11/24 1026 02/11/24 1210 02/12/24 0401  WBC 10.1  --  7.7  HGB 14.1 13.6 13.8  HCT 42.9 40.0 42.4  PLT 335  --  287   BMET Recent Labs    02/11/24 1026 02/11/24 1210 02/12/24 0401  NA 138 139 137  K 4.1 3.9 4.0  CL 102 103 106  CO2 26  --  25  GLUCOSE 101* 93 118*  BUN 18 15 14   CREATININE 0.54* 1.00 0.72  CALCIUM 9.2  --  8.3*   LFT    Latest Ref Rng & Units 02/12/2024    4:01 AM 02/11/2024   10:26 AM 02/10/2024    3:20 PM  Hepatic Function  Total Protein 6.5 - 8.1 g/dL 6.6  7.2  7.9   Albumin 3.5 - 5.0 g/dL 3.7  4.1  4.4   AST 15 - 41 U/L 17  23  30    ALT 0 - 44 U/L 20  23  28    Alk Phosphatase 38 - 126 U/L 47  51  60   Total Bilirubin 0.0 - 1.2 mg/dL 1.1  1.0  1.0    PT/INR No results for input(s): "LABPROT", "INR" in the last 72 hours. ABG No results for input(s): "PHART", "HCO3" in the last 72 hours.  Invalid input(s): "PCO2", "PO2"  Studies/Results:  Anti-infectives: Anti-infectives  (From admission, onward)    Start     Dose/Rate Route Frequency Ordered Stop   02/12/24 0845  ceFAZolin (ANCEF) IVPB 2g/100 mL premix        2 g 200 mL/hr over 30 Minutes Intravenous On call 02/12/24 0758 02/13/24 0845       Medications: Scheduled Meds:  enoxaparin (LOVENOX) injection  40 mg Subcutaneous Q24H   Continuous Infusions:   ceFAZolin (ANCEF) IV     dextrose 5 % and 0.9 % NaCl with KCl 20 mEq/L 100 mL/hr at 02/12/24 0608   PRN Meds:.acetaminophen **OR** acetaminophen, methocarbamol (ROBAXIN) injection, morphine injection, ondansetron (ZOFRAN) IV  Assessment/Plan: Patient Active Problem List   Diagnosis Date Noted   SBO (small bowel obstruction) (HCC) 02/11/2024   HLD (hyperlipidemia) 04/24/2022   Family history of melanoma 02/18/2016   SBO -Patient still has obstipation.  His x-ray this morning shows increased bowel distention.  Contrast is still in the stomach.  Since there was concerns for potential closed-loop obstruction on his admission CT and there is no signs of bowel function I recommended proceeding to the operating room this morning.  We discussed  that we would start with a diagnostic laparoscopy but could convert to exploratory laparotomy and potential bowel resection..  The wife was on the phone listening to rationale for surgery and potential surgical complications and typical postoperative recovery  I discussed the procedure in detail.   We discussed the risks and benefits of surgery including, but not limited to bleeding, infection (such as wound infection, abdominal abscess), injury to surrounding structures, blood clot formation, urinary retention, incisional hernia, (anastomotic stricture, anastomotic leak), anesthesia risks, pulmonary & cardiac complications such as pneumonia &/or heart attack, need for additional procedures, ileus, & prolonged hospitalization.  We discussed the typical postoperative recovery course, including limitations & restrictions  postoperatively. I explained that the likelihood of improvement in their symptoms is good.  IV abx on call  All questions asked and answered.  Will proceed to operating room this morning Disposition:  LOS: 1 day    Mary Sella. Andrey Campanile, MD, FACS General, Bariatric, & Minimally Invasive Surgery [phone] [facility] Surgery, A [facility]

## 2024-02-12 NOTE — Op Note (Signed)
 02/12/2024  10:38 AM  PATIENT:  Victor Nixon  58 y.o. male  PRE-OPERATIVE DIAGNOSIS:  SMALL BOWEL OBSTRUCTION  POST-OPERATIVE DIAGNOSIS:  SMALL BOWEL OBSTRUCTION due to internal hernia/congential adhesive band  PROCEDURE:  Procedure(s): LAPAROSCOPY, DIAGNOSTIC, ASSESSMENT OF PERFUSION USING ICG LYSIS, ADHESIONS, LAPAROSCOPIC  SURGEON:  Surgeon(s): Gaynelle Adu, MD  ASSISTANTS: none   ANESTHESIA:   general  DRAINS: Nasogastric Tube and Urinary Catheter (Foley)   LOCAL MEDICATIONS USED:  BUPIVICAINE   SPECIMEN:  No Specimen  DISPOSITION OF SPECIMEN:  N/A  COUNTS:  YES  INDICATION FOR PROCEDURE: 58 year old gentleman came to the ER with worsening abdominal pain along with nausea and vomiting.  CT scan showed evidence of a partial small bowel obstruction with questionable closed-loop obstruction due to internal hernia.  There is no leukocytosis, tachycardia bowel wall thickening, pneumatosis elevated lactate.  Had not really had any prior abdominal surgery.  He had had a laparoscopic tep procedure.  We felt we could start with initial nonoperative management with a low threshold for taking him to the operating room if he did not resolve quickly.  He also had relayed history of intermittent bowel issues with a negative colonoscopy.  Small bowel protocol was initiated.  This morning the patient was still obstipated.  His 8-hour delay film with a small bowel obstruction protocol showed contrast still in the stomach along with worsening small bowel distention.  I recommended proceeding to the operating room for diagnostic laparoscopy, possible exploratory laparotomy and possible bowel resection.  Risk and benefits are separately documented  PROCEDURE: After obtaining informed consent the patient was taken the OR for at [facility] long hospital placed supine on the operating room table.  General endotracheal anesthesia was established.  Sequential compression devices were placed.  A Foley  catheter was placed.  He received Ancef without any side effects.  Surgical timeout was performed.  His abdomen had been prepped and draped in the usual standard surgical fashion.  Local was used at the base of the umbilicus and I incised his old laparoscopic incision.  Fascia was incised with scalpel and the abdominal cavity was entered.  A pursestring suture was placed around the fascial edges with the 0 Vicryl.  A 12 mm Hassan trocar was placed followed by establishment of pneumoperitoneum.  There were no change in patient vital signs.  The laparoscope was advanced and the abdominal cavity was surveilled.  There was no evidence of injury to surrounding viscera.  There is obvious proximal small bowel distention.  There was one segment that had hyperemic changes to it.  The distal small bowel was decompressed.  There was a little bit of murky fluid in the pelvis.  An additional 5 mm trocar was placed in the left upper quadrant and 1 in the left lower quadrant under direct visualization.  There appeared to be a dense adhesive band from the mesenteric aspect 1 loop of small bowel going to the base of an adjacent small bowel mesenteric segment causing a closed-loop obstruction/internal hernia.  I was able to get a [place] underneath it and lifted up.  Then using a [place] with cautery I cauterized it and then cut it.  This freed the segment of bowel.  It immediately released.  I then started running the small bowel from the terminal ileum at its junction with the cecum all the way up to the ligament of Treitz.  The distal ileum and distal jejunum were completely decompressed.  There is no evidence of small bowel pathology.  I found the segment that had been kinked.  It had pinked up.  There is no signs of ongoing ischemia.  There is no signs of stricture.  I continued running the bowel more proximally obviously this area was significantly dilated.  We were able to run it all the way up to the ligament of Treitz.   The area of the small bowel where the adhesive band had been tethered to there was almost like a exophytic/serosal piece of scar on that section of the mid jejunum.  It did not appear to be a mass or carcinoid tumor per se.  It appeared to be chronic scar tissue on the outside of the bowel wall.  Again no underlying or associated stricture since that segment of the bowel was dilated.  I evacuated the murky fluid/yellow-tinged fluid out of the pelvis.  There is no injury to surrounding viscera.  Anesthesia administered 2 mL of indocyanine green dye and then using the St[employer] near infrared fluorescent visualization system we visualized excellent perfusion throughout the entire small bowel.  There is no signs of ischemia serving as a secondary confirmation of no bowel ischemia.  The [facility] trocar was removed and I tied down the pursestring suture.  I elected to place an additional interrupted 0 Vicryl using PMI suture passer with laparoscopic guidance.  There is no air leak.  PLAN OF CARE:  already inpatient  PATIENT DISPOSITION:  PACU - hemodynamically stable.   Delay start of Pharmacological VTE agent (>24hrs) due to surgical blood loss or risk of bleeding:  no  Mary Sella. Andrey Campanile, MD, FACS General, Bariatric, & Minimally Invasive Surgery [facility] Surgery,  A [facility]

## 2024-02-12 NOTE — Plan of Care (Signed)

## 2024-02-12 NOTE — Discharge Instructions (Signed)

## 2024-02-13 ENCOUNTER — Encounter (HOSPITAL_COMMUNITY): Payer: Self-pay | Admitting: General Surgery

## 2024-02-13 DIAGNOSIS — E7800 Pure hypercholesterolemia, unspecified: Secondary | ICD-10-CM | POA: Diagnosis not present

## 2024-02-13 DIAGNOSIS — K56609 Unspecified intestinal obstruction, unspecified as to partial versus complete obstruction: Secondary | ICD-10-CM | POA: Diagnosis not present

## 2024-02-13 LAB — COMPREHENSIVE METABOLIC PANEL
ALT: 15 U/L (ref 0–44)
AST: 15 U/L (ref 15–41)
Albumin: 3.3 g/dL — ABNORMAL LOW (ref 3.5–5.0)
Alkaline Phosphatase: 46 U/L (ref 38–126)
Anion gap: 8 (ref 5–15)
BUN: 13 mg/dL (ref 6–20)
CO2: 27 mmol/L (ref 22–32)
Calcium: 8.5 mg/dL — ABNORMAL LOW (ref 8.9–10.3)
Chloride: 104 mmol/L (ref 98–111)
Creatinine, Ser: 0.85 mg/dL (ref 0.61–1.24)
GFR, Estimated: >60 mL/min (ref >60)
Glucose, Bld: 108 mg/dL — ABNORMAL HIGH (ref 70–99)
Potassium: 3.8 mmol/L (ref 3.5–5.1)
Sodium: 139 mmol/L (ref 135–145)
Total Bilirubin: 1.2 mg/dL (ref 0.0–1.2)
Total Protein: 6.4 g/dL — ABNORMAL LOW (ref 6.5–8.1)

## 2024-02-13 LAB — PHOSPHORUS: Phosphorus: 2.9 mg/dL (ref 2.5–4.6)

## 2024-02-13 LAB — CBC
HCT: 40.1 % (ref 39.0–52.0)
Hemoglobin: 12.9 g/dL — ABNORMAL LOW (ref 13.0–17.0)
MCH: 30.1 pg (ref 26.0–34.0)
MCHC: 32.2 g/dL (ref 30.0–36.0)
MCV: 93.5 fL (ref 80.0–100.0)
Platelets: 253 10*3/uL (ref 150–400)
RBC: 4.29 MIL/uL (ref 4.22–5.81)
RDW: 12.6 % (ref 11.5–15.5)
WBC: 9.8 10*3/uL (ref 4.0–10.5)
nRBC: 0 % (ref 0.0–0.2)

## 2024-02-13 LAB — MAGNESIUM: Magnesium: 1.9 mg/dL (ref 1.7–2.4)

## 2024-02-13 LAB — GLUCOSE, CAPILLARY
Glucose-Capillary: 107 mg/dL — ABNORMAL HIGH (ref 70–99)
Glucose-Capillary: 89 mg/dL (ref 70–99)

## 2024-02-13 MED ORDER — DEXTROSE IN LACTATED RINGERS 5 % IV SOLN: INTRAVENOUS | Status: DC

## 2024-02-13 MED ORDER — ACETAMINOPHEN 10 MG/ML IV SOLN
1000.0000 mg | Freq: Four times a day (QID) | INTRAVENOUS | Status: AC
  Administered 2024-02-13 (×2): 1000 mg via INTRAVENOUS
  Filled 2024-02-13 (×4): qty 100

## 2024-02-13 NOTE — TOC Initial Note (Addendum)
 Transition of Care [facility]) - Initial/Assessment Note    Patient Details  Name: Victor Nixon MRN: [id number] Date of Birth: [birth year]  [facility]) CM/SW Contact:    Victor Prows, RN Phone Number: 02/13/2024, 3:50 PM  Clinical Narrative:                 Victor Nixon w/ pt and wife Victor Nixon [phone]) in room; pt says he lives at home; he plans to return at d/c; he verified insurance/PCP; pt denies SDOH risks; he does not have DME, HH services, or home oxygen; no TOC needs; TOC signing off; please place consult if needed.  Expected Discharge Plan: Home/Self Care Barriers to Discharge: Continued Medical Work up   Patient Goals and CMS Choice Patient states their goals for this hospitalization and ongoing recovery are:: home          Expected Discharge Plan and Services   Discharge Planning Services: CM Consult   Living arrangements for the past 2 months: Single Family Home                                      Prior Living Arrangements/Services Living arrangements for the past 2 months: Single Family Home Lives with:: Spouse Patient language and need for interpreter reviewed:: Yes Do you feel safe going back to the place where you live?: Yes      Need for Family Participation in Patient Care: Yes (Comment) Care giver support system in place?: Yes (comment) Current home services:  (n/a) Criminal Activity/Legal Involvement Pertinent to Current Situation/Hospitalization: No - Comment as needed  Activities of Daily Living   ADL Screening (condition at time of admission) Independently performs ADLs?: Yes (appropriate for developmental age) Is the patient deaf or have difficulty hearing?: No Does the patient have difficulty seeing, even when wearing glasses/contacts?: No Does the patient have difficulty concentrating, remembering, or making decisions?: No  Permission Sought/Granted Permission sought to share information with : Case  Manager Permission granted to share information with : Yes, Verbal Permission Granted  Share Information with NAME: Case Manager     Permission granted to share info w Relationship: Victor Nixon (spouse) [phone]     Emotional Assessment Appearance:: Appears stated age Attitude/Demeanor/Rapport: Gracious Affect (typically observed): Accepting Orientation: : Oriented to Self, Oriented to Place, Oriented to  Time, Oriented to Situation Alcohol / Substance Use: Not Applicable Psych Involvement: No (comment)  Admission diagnosis:  Small bowel obstruction (HCC) [K56.609] SBO (small bowel obstruction) (HCC) [K56.609] Patient Active Problem List   Diagnosis Date Noted   SBO (small bowel obstruction) (HCC) 02/11/2024   HLD (hyperlipidemia) 04/24/2022   Family history of melanoma 02/18/2016   PCP:  Victor Quint, MD Pharmacy:   [facility] [phone] - [place], [place] - [address] [place] [address] Phone: [phone] Fax: [phone]  [facility] - [place], [place] - [address] [facility] [address] [place] [address] [place] [address] Phone: [phone] Fax: [phone]  [facility] - [place], [place] - [address] [place] [address] Phone: [phone] Fax: [phone]  [facility] - [place], [place] - [address] [place] [address] Phone: [phone] Fax: [phone]     Social Drivers of Health (SDOH)  Social History: SDOH Screenings   Food Insecurity: No Food Insecurity (02/13/2024)  Housing: Low Risk  (02/13/2024)  Transportation Needs: No Transportation Needs (02/13/2024)  Utilities: Not At Risk (02/13/2024)  Depression (PHQ2-9): Low Risk  (01/06/2024)  Tobacco Use: Low Risk  (02/12/2024)   SDOH Interventions: Food Insecurity Interventions: Intervention Not Indicated,  Inpatient TOC Housing Interventions: Intervention Not Indicated, Inpatient TOC Transportation Interventions: Intervention Not Indicated, Inpatient TOC Utilities Interventions: Intervention Not Indicated, Inpatient TOC   Readmission Risk Interventions     No data to display

## 2024-02-13 NOTE — Plan of Care (Signed)
  Problem: Clinical Measurements: Goal: Diagnostic test results will improve Outcome: Progressing   Problem: Coping: Goal: Level of anxiety will decrease Outcome: Progressing   Problem: Pain Managment: Goal: General experience of comfort will improve and/or be controlled Outcome: Progressing   Problem: Safety: Goal: Ability to remain free from injury will improve Outcome: Progressing   Problem: Skin Integrity: Goal: Risk for impaired skin integrity will decrease Outcome: Progressing

## 2024-02-13 NOTE — Plan of Care (Signed)
 NG tube clamped as per order since 10 am. No nausea or vomiting noted during tube being clamped. As per order NG tube removed. Pt continues to be NPO except ice chips and sips with meds. Foley catheter removed @ 10 as per order. Pt voiding upon removal. No c/o of pain or discomfort.  Problem: Education: Goal: Knowledge of General Education information will improve Description: Including pain rating scale, medication(s)/side effects and non-pharmacologic comfort measures Outcome: Progressing   Problem: Health Behavior/Discharge Planning: Goal: Ability to manage health-related needs will improve Outcome: Progressing   Problem: Clinical Measurements: Goal: Ability to maintain clinical measurements within normal limits will improve Outcome: Progressing Goal: Will remain free from infection Outcome: Progressing Goal: Diagnostic test results will improve Outcome: Progressing Goal: Respiratory complications will improve Outcome: Progressing Goal: Cardiovascular complication will be avoided Outcome: Progressing   Problem: Activity: Goal: Risk for activity intolerance will decrease Outcome: Progressing   Problem: Nutrition: Goal: Adequate nutrition will be maintained Outcome: Progressing   Problem: Coping: Goal: Level of anxiety will decrease Outcome: Progressing   Problem: Elimination: Goal: Will not experience complications related to bowel motility Outcome: Progressing Goal: Will not experience complications related to urinary retention Outcome: Progressing   Problem: Pain Managment: Goal: General experience of comfort will improve and/or be controlled Outcome: Progressing   Problem: Safety: Goal: Ability to remain free from injury will improve Outcome: Progressing   Problem: Skin Integrity: Goal: Risk for impaired skin integrity will decrease Outcome: Progressing

## 2024-02-13 NOTE — Progress Notes (Signed)
 [facility]  PROGRESS NOTE  JAELYN CLONINGER ZOX:[id number] DOB: 05-Feb-1966 DOA: 02/11/2024 PCP: Georgina Quint, MD   Brief HPI:   58 y.o. male with medical history significant of seasonal allergies, heart murmur, hyperlipidemia, urolithiasis, history of hernia repair surgery who presented to the emergency department for the second day in a row due to abdominal pain associated with nausea, no emesis.  He felt better yesterday in the emergency department and went home.  However, his symptoms reexacerbated after he drank some coffee.  Currently not having flatus.  His last bowel movement was yesterday. No diarrhea, constipation, melena or hematochezia.  No flank pain, dysuria, frequency or hematuria. He denied fever, chills, rhinorrhea, sore throat, wheezing or hemoptysis  CT abdomen/pelvis contrast showing persistent and progressive small bowel obstruction with findings concerning for closed-loop obstruction and possible developing bowel ischemia.     Assessment/Plan:   SBO (small bowel obstruction) (HCC) -Status post lysis of adhesions -General Surgery following, NG tube has been clamped -Likely NG tube will be removed today -Management per general surgery Continue D5 LR at 75 mL/h for 1 more day  Hyperlipidemia -Hold rosuvastatin for now    Medications     enoxaparin (LOVENOX) injection  40 mg Subcutaneous Q24H     Data Reviewed:   CBG:  Recent Labs  Lab 02/12/24 1426 02/12/24 2328 02/13/24 0547  GLUCAP 126* 107* 107*    SpO2: 97 % O2 Flow Rate (L/min): 5 L/min    Vitals:   02/12/24 1145 02/12/24 1423 02/12/24 2113 02/13/24 0545  BP: 125/77 130/82 132/81 137/81  Pulse: 66 68 (!) 59 70  Resp: 13  16 16   Temp:  98.7 F (37.1 C) 98.2 F (36.8 C) 98.4 F (36.9 C)  TempSrc:      SpO2: 93% 95% 98% 97%  Weight:      Height:          Data Reviewed:  Basic Metabolic Panel: Recent Labs  Lab 02/10/24 1520 02/11/24 1026 02/11/24 1210  02/11/24 1806 02/12/24 0401 02/13/24 0446  NA 138 138 139  --  137 139  K 3.4* 4.1 3.9  --  4.0 3.8  CL 100 102 103  --  106 104  CO2 24 26  --   --  25 27  GLUCOSE 123* 101* 93  --  118* 108*  BUN 16 18 15   --  14 13  CREATININE 0.89 0.54* 1.00  --  0.72 0.85  CALCIUM 9.1 9.2  --   --  8.3* 8.5*  MG  --   --   --  1.9 1.9 1.9  PHOS  --   --   --   --  2.4* 2.9    CBC: Recent Labs  Lab 02/10/24 1520 02/11/24 1026 02/11/24 1210 02/12/24 0401 02/13/24 0446  WBC 7.8 10.1  --  7.7 9.8  NEUTROABS 3.5  --   --   --   --   HGB 14.8 14.1 13.6 13.8 12.9*  HCT 43.7 42.9 40.0 42.4 40.1  MCV 90.7 92.3  --  92.8 93.5  PLT 382 335  --  287 253    LFT Recent Labs  Lab 02/10/24 1520 02/11/24 1026 02/12/24 0401 02/13/24 0446  AST 30 23 17 15   ALT 28 23 20 15   ALKPHOS 60 51 47 46  BILITOT 1.0 1.0 1.1 1.2  PROT 7.9 7.2 6.6 6.4*  ALBUMIN 4.4 4.1 3.7 3.3*     Antibiotics: Anti-infectives (From admission, onward)  Start     Dose/Rate Route Frequency Ordered Stop   02/12/24 0845  ceFAZolin (ANCEF) IVPB 2g/100 mL premix        2 g 200 mL/hr over 30 Minutes Intravenous On call 02/12/24 0758 02/12/24 1008        DVT prophylaxis: Lovenox  Code Status: Full code  Family Communication: Discussed with patient's wife at bedside   CONSULTS General Surgery   Subjective   NG tube clamped this morning by general surgery.  Passing flatus.   Objective    Physical Examination:   Appears in no acute distress Extremities no edema   Status is: Inpatient:             Meredeth Ide   [facility] If 7PM-7AM, please contact night-coverage at [email or web address], Office  [phone]   02/13/2024, 10:52 AM  LOS: 2 days

## 2024-02-13 NOTE — Progress Notes (Signed)
 1 Day Post-Op   Subjective/Chief Complaint: Patient passing flatus multiple times    Objective: Vital signs in last 24 hours: Temp:  [97.7 F (36.5 C)-98.7 F (37.1 C)] 98.4 F (36.9 C) (03/22 0545) Pulse Rate:  [59-95] 70 (03/22 0545) Resp:  [10-17] 16 (03/22 0545) BP: (125-142)/(76-89) 137/81 (03/22 0545) SpO2:  [93 %-100 %] 97 % (03/22 0545) Last BM Date : 02/10/24  Intake/Output from previous day: 03/21 0701 - 03/22 0700 In: 2297.6 [I.V.:2037.6; NG/GT:60; IV Piggyback:200] Out: 860 [Urine:450; Emesis/NG output:400; Blood:10] Intake/Output this shift: No intake/output data recorded.  Exam: Awake and alert Comfortable Abdomen soft, non-distended  Lab Results:  Recent Labs    02/12/24 0401 02/13/24 0446  WBC 7.7 9.8  HGB 13.8 12.9*  HCT 42.4 40.1  PLT 287 253   BMET Recent Labs    02/12/24 0401 02/13/24 0446  NA 137 139  K 4.0 3.8  CL 106 104  CO2 25 27  GLUCOSE 118* 108*  BUN 14 13  CREATININE 0.72 0.85  CALCIUM 8.3* 8.5*   PT/INR No results for input(s): "LABPROT", "INR" in the last 72 hours. ABG No results for input(s): "PHART", "HCO3" in the last 72 hours.  Invalid input(s): "PCO2", "PO2"  Studies/Results: DG Abd Portable 1V Result Date: 02/12/2024 CLINICAL DATA:  Small bowel obstruction. EXAM: PORTABLE ABDOMEN - 1 VIEW COMPARISON:  One-view abdomen 02/12/2024 at 1:56 a.m. FINDINGS: Dilated loops of small bowel are again noted in the upper abdomen. Contrast previously seen in the stomach is now present in the small bowel near the transition site. NG tube is in the stomach. IMPRESSION: 1. Persistent small bowel obstruction. 2. Contrast previously seen in the stomach is now present in the small bowel near the transition site. Electronically Signed   By: Marin Roberts M.D.   On: 02/12/2024 12:09   DG Abd Portable 1V-Small Bowel Obstruction Protocol-initial, 8 hr delay Result Date: 02/12/2024 CLINICAL DATA:  Small bowel obstruction 8 hours  postcontrast. EXAM: PORTABLE ABDOMEN - 1 VIEW COMPARISON:  CT yesterday at 10:31 a.m., upper abdomen single film yesterday at 3:06 p.m. FINDINGS: AP single view at 1:56 a.m. There is contrast in the dependent aspect of the proximal stomach, but contrast is not seen in the bowel. NGT has been advanced in the stomach terminating over the proximal gastric body. Small bowel dilatation in the upper to mid abdomen has increased, now 4.5 cm was previously 3.7 cm. Small amount of gas and stool are again noted in the ascending and descending colon. The true pelvis was not included in the exam. There is no supine evidence of free air.  Lung bases are clear. IMPRESSION: 1. Contrast in the dependent aspect of the proximal stomach, but contrast is not seen in the bowel. 2. NGT has been advanced into the stomach terminating over the proximal gastric body. 3. Small bowel dilatation in the upper to mid abdomen has increased, now 4.5 cm was previously 3.7 cm. Electronically Signed   By: Almira Bar M.D.   On: 02/12/2024 02:47   DG Abd Portable 1V-Small Bowel Protocol-Position Verification Result Date: 02/11/2024 CLINICAL DATA:  Nasogastric tube placement. EXAM: PORTABLE ABDOMEN - 1 VIEW COMPARISON:  February 10, 2024. FINDINGS: Nasogastric tube tip is seen just beyond the expected position of gastroesophageal junction, with distal side hole seen in distal esophagus. Advancement is recommended. IMPRESSION: Nasogastric tube tip is just beyond the expected position of gastroesophageal junction. Advancement is recommended. Electronically Signed   By: Zenda Alpers.D.  On: 02/11/2024 15:59   CT ABDOMEN PELVIS W CONTRAST Result Date: 02/11/2024 CLINICAL DATA:  Concern for bowel obstruction.  Abdominal pain. EXAM: CT ABDOMEN AND PELVIS WITH CONTRAST TECHNIQUE: Multidetector CT imaging of the abdomen and pelvis was performed using the standard protocol following bolus administration of intravenous contrast. RADIATION DOSE  REDUCTION: This exam was performed according to the departmental dose-optimization program which includes automated exposure control, adjustment of the mA and/or kV according to patient size and/or use of iterative reconstruction technique. CONTRAST:  80mL OMNIPAQUE IOHEXOL 300 MG/ML  SOLN COMPARISON:  CT abdomen pelvis dated 02/10/2024. FINDINGS: Lower chest: Minimal bibasilar subpleural atelectasis. The visualized lung bases are otherwise clear. No intra-abdominal free air or free fluid. Hepatobiliary: The liver is unremarkable. No biliary dilatation. The cutis excretion of contrast in the gallbladder. No pericholecystic fluid. Pancreas: Unremarkable. No pancreatic ductal dilatation or surrounding inflammatory changes. Spleen: Normal in size without focal abnormality. Adrenals/Urinary Tract: The adrenal glands unremarkable. There is no hydronephrosis on either side. There is symmetric enhancement and excretion of contrast by both kidneys. The visualized ureters and urinary bladder appear unremarkable. Stomach/Bowel: There is persistent and progressive dilatation of several small bowel loops in the mid abdomen measuring up to 4 cm in caliber. There appears to be two transition points (coronal 41 and 52 series 8). Findings concerning for a closed loop obstruction, possibly secondary to an internal hernia if there is history of prior surgery. Surgical consult is advised. There is edema and inflammatory changes in the associated mesentery which may be reactive or represent developing ischemia. No pneumatosis at this time. The colon is collapsed.  The appendix is unremarkable. Vascular/Lymphatic: The abdominal aorta and IVC are unremarkable. Decreased opacification of a small bowel vein may be related to timing of the contrast or decreased bowel perfusion (45/2). No portal venous gas. There is no adenopathy. Reproductive: The prostate and seminal vesicles are grossly unremarkable. Other: None Musculoskeletal: No acute  or significant osseous findings. IMPRESSION: Persistent and progressive small bowel obstruction with findings concerning for a closed loop obstruction and possible developing bowel ischemia. Surgical consult is advised. Electronically Signed   By: Elgie Collard M.D.   On: 02/11/2024 11:17    Anti-infectives: Anti-infectives (From admission, onward)    Start     Dose/Rate Route Frequency Ordered Stop   02/12/24 0845  ceFAZolin (ANCEF) IVPB 2g/100 mL premix        2 g 200 mL/hr over 30 Minutes Intravenous On call 02/12/24 0758 02/12/24 1008       Assessment/Plan: s/p Procedure(s) with comments: LAPAROSCOPY, DIAGNOSTIC, ASSESSMENT OF PERFUSION USING ICG (N/A) - EXPLORATORY LAP, POSSIBLE SMALL BOWEL RESECTION LYSIS, ADHESIONS, LAPAROSCOPIC  POD#1  -clamp NG.  Remove after 6 hours if no nausea or emesis -sips and ice chips -d/c foley -ambulate  LOS: 2 days    Abigail Miyamoto 02/13/2024

## 2024-02-14 DIAGNOSIS — K56609 Unspecified intestinal obstruction, unspecified as to partial versus complete obstruction: Secondary | ICD-10-CM | POA: Diagnosis not present

## 2024-02-14 LAB — GLUCOSE, CAPILLARY
Glucose-Capillary: 77 mg/dL (ref 70–99)
Glucose-Capillary: 83 mg/dL (ref 70–99)
Glucose-Capillary: 91 mg/dL (ref 70–99)

## 2024-02-14 LAB — COMPREHENSIVE METABOLIC PANEL
ALT: 15 U/L (ref 0–44)
AST: 17 U/L (ref 15–41)
Albumin: 3.4 g/dL — ABNORMAL LOW (ref 3.5–5.0)
Alkaline Phosphatase: 44 U/L (ref 38–126)
Anion gap: 8 (ref 5–15)
BUN: 11 mg/dL (ref 6–20)
CO2: 25 mmol/L (ref 22–32)
Calcium: 8.4 mg/dL — ABNORMAL LOW (ref 8.9–10.3)
Chloride: 104 mmol/L (ref 98–111)
Creatinine, Ser: 0.84 mg/dL (ref 0.61–1.24)
GFR, Estimated: >60 mL/min (ref >60)
Glucose, Bld: 101 mg/dL — ABNORMAL HIGH (ref 70–99)
Potassium: 3.4 mmol/L — ABNORMAL LOW (ref 3.5–5.1)
Sodium: 137 mmol/L (ref 135–145)
Total Bilirubin: 1.3 mg/dL — ABNORMAL HIGH (ref 0.0–1.2)
Total Protein: 6.3 g/dL — ABNORMAL LOW (ref 6.5–8.1)

## 2024-02-14 MED ORDER — TRAMADOL HCL 50 MG PO TABS
50.0000 mg | ORAL_TABLET | Freq: Four times a day (QID) | ORAL | 0 refills | Status: AC | PRN
Start: 2024-02-14 — End: 2024-02-19

## 2024-02-14 MED ORDER — TRAMADOL HCL 50 MG PO TABS: 50.0000 mg | ORAL_TABLET | Freq: Four times a day (QID) | ORAL | Status: DC | PRN

## 2024-02-14 NOTE — Discharge Summary (Signed)
 Physician Discharge Summary   Patient: Victor Nixon MRN: [id number] DOB: 29-Sep-1966  Admit date:     02/11/2024  Discharge date: 02/14/24  Discharge Physician: Lilia Pro   PCP: Georgina Quint, MD   Recommendations at discharge:    Advance diet as tolerated Follow up with general surgery as scheduled  Discharge Diagnoses: Principal Problem:   SBO (small bowel obstruction) (HCC) Active Problems:   HLD (hyperlipidemia)  Resolved Problems:   * No resolved hospital problems. *  Hospital Course:  58 y.o. male with medical history significant of seasonal allergies, heart murmur, hyperlipidemia, urolithiasis, history of hernia repair surgery who presented to the emergency department for the second day in a row due to abdominal pain associated with nausea, no emesis.  He felt better yesterday in the emergency department and went home.  However, his symptoms reexacerbated after he drank some coffee.  Currently not having flatus.  His last bowel movement was yesterday. No diarrhea, constipation, melena or hematochezia.  No flank pain, dysuria, frequency or hematuria. He denied fever, chills, rhinorrhea, sore throat, wheezing or hemoptysis  CT abdomen/pelvis contrast showing persistent and progressive small bowel obstruction with findings concerning for closed-loop obstruction and possible developing bowel ischemia.  He was seen and assessed by general surgery and is status post laparoscopic diagnostic assessment of perfusion, lysis of adhesions. Assessment and Plan:  Small bowel obstruction status post lysis of adhesions.  Procedure was performed by general surgery team.  NG tube was removed.  Diet was advanced without any adverse sequelae.  Patient is having bowel movement.  Tolerating oral intake well.  Feels significantly well and wishes to be discharged this afternoon.  Patient will follow-up with general surgery team as outpatient.  Diet will be advanced as tolerated by  patient.  Patient is encouraged to ambulate as tolerated.  Patient is in agreement with this plan as detailed   Pain control - [facility] Controlled Substance Reporting System database was reviewed. and patient was instructed, not to drive, operate heavy machinery, perform activities at heights, swimming or participation in water activities or provide baby-sitting services while on Pain, Sleep and Anxiety Medications; until their outpatient Physician has advised to do so again. Also recommended to not to take more than prescribed Pain, Sleep and Anxiety Medications.  Consultants: General Surgery Procedures performed: Laparoscopic adhesiolysis Disposition: Home Diet recommendation:  Discharge Diet Orders (From admission, onward)     Start     Ordered   02/14/24 0000  Diet - low sodium heart healthy        02/14/24 1526           Regular diet DISCHARGE MEDICATION:   Follow-up Information     Maczis, Hedda Slade, PA-C Follow up.   Specialty: General Surgery Why: 4/22 at 10:15. Please bring a copy of your photo ID, insurance card and arrive 30 minutes prior to your appointment for paperwork. Contact information: [address] [place] [address] [phone]                Discharge Exam: Ceasar Mons Weights   02/11/24 0910 02/12/24 0836  Weight: 82 kg 82 kg    Patient was seen and evaluated personally by me.  He is alert oriented x 3.  Not in any acute distress.  He looks well-hydrated.  Neck supple no JVD.  Chest clinically clear.  Abdomen with laparoscopic incisions.  No obvious discharge is appreciated.  Extremities without pedal edema.  CNS with no  focal deficit.  Condition at discharge: good  The results of significant diagnostics from this hospitalization (including imaging, microbiology, ancillary and laboratory) are listed below for reference.   Imaging Studies: DG Abd Portable 1V Result Date: 02/12/2024 CLINICAL DATA:  Small bowel obstruction.  EXAM: PORTABLE ABDOMEN - 1 VIEW COMPARISON:  One-view abdomen 02/12/2024 at 1:56 a.m. FINDINGS: Dilated loops of small bowel are again noted in the upper abdomen. Contrast previously seen in the stomach is now present in the small bowel near the transition site. NG tube is in the stomach. IMPRESSION: 1. Persistent small bowel obstruction. 2. Contrast previously seen in the stomach is now present in the small bowel near the transition site. Electronically Signed   By: Marin Roberts M.D.   On: 02/12/2024 12:09   DG Abd Portable 1V-Small Bowel Obstruction Protocol-initial, 8 hr delay Result Date: 02/12/2024 CLINICAL DATA:  Small bowel obstruction 8 hours postcontrast. EXAM: PORTABLE ABDOMEN - 1 VIEW COMPARISON:  CT yesterday at 10:31 a.m., upper abdomen single film yesterday at 3:06 p.m. FINDINGS: AP single view at 1:56 a.m. There is contrast in the dependent aspect of the proximal stomach, but contrast is not seen in the bowel. NGT has been advanced in the stomach terminating over the proximal gastric body. Small bowel dilatation in the upper to mid abdomen has increased, now 4.5 cm was previously 3.7 cm. Small amount of gas and stool are again noted in the ascending and descending colon. The true pelvis was not included in the exam. There is no supine evidence of free air.  Lung bases are clear. IMPRESSION: 1. Contrast in the dependent aspect of the proximal stomach, but contrast is not seen in the bowel. 2. NGT has been advanced into the stomach terminating over the proximal gastric body. 3. Small bowel dilatation in the upper to mid abdomen has increased, now 4.5 cm was previously 3.7 cm. Electronically Signed   By: Almira Bar M.D.   On: 02/12/2024 02:47   DG Abd Portable 1V-Small Bowel Protocol-Position Verification Result Date: 02/11/2024 CLINICAL DATA:  Nasogastric tube placement. EXAM: PORTABLE ABDOMEN - 1 VIEW COMPARISON:  February 10, 2024. FINDINGS: Nasogastric tube tip is seen just beyond the  expected position of gastroesophageal junction, with distal side hole seen in distal esophagus. Advancement is recommended. IMPRESSION: Nasogastric tube tip is just beyond the expected position of gastroesophageal junction. Advancement is recommended. Electronically Signed   By: Lupita Raider M.D.   On: 02/11/2024 15:59   CT ABDOMEN PELVIS W CONTRAST Result Date: 02/11/2024 CLINICAL DATA:  Concern for bowel obstruction.  Abdominal pain. EXAM: CT ABDOMEN AND PELVIS WITH CONTRAST TECHNIQUE: Multidetector CT imaging of the abdomen and pelvis was performed using the standard protocol following bolus administration of intravenous contrast. RADIATION DOSE REDUCTION: This exam was performed according to the departmental dose-optimization program which includes automated exposure control, adjustment of the mA and/or kV according to patient size and/or use of iterative reconstruction technique. CONTRAST:  80mL OMNIPAQUE IOHEXOL 300 MG/ML  SOLN COMPARISON:  CT abdomen pelvis dated 02/10/2024. FINDINGS: Lower chest: Minimal bibasilar subpleural atelectasis. The visualized lung bases are otherwise clear. No intra-abdominal free air or free fluid. Hepatobiliary: The liver is unremarkable. No biliary dilatation. The cutis excretion of contrast in the gallbladder. No pericholecystic fluid. Pancreas: Unremarkable. No pancreatic ductal dilatation or surrounding inflammatory changes. Spleen: Normal in size without focal abnormality. Adrenals/Urinary Tract: The adrenal glands unremarkable. There is no hydronephrosis on either side. There is symmetric enhancement and excretion of contrast  by both kidneys. The visualized ureters and urinary bladder appear unremarkable. Stomach/Bowel: There is persistent and progressive dilatation of several small bowel loops in the mid abdomen measuring up to 4 cm in caliber. There appears to be two transition points (coronal 41 and 52 series 8). Findings concerning for a closed loop obstruction,  possibly secondary to an internal hernia if there is history of prior surgery. Surgical consult is advised. There is edema and inflammatory changes in the associated mesentery which may be reactive or represent developing ischemia. No pneumatosis at this time. The colon is collapsed.  The appendix is unremarkable. Vascular/Lymphatic: The abdominal aorta and IVC are unremarkable. Decreased opacification of a small bowel vein may be related to timing of the contrast or decreased bowel perfusion (45/2). No portal venous gas. There is no adenopathy. Reproductive: The prostate and seminal vesicles are grossly unremarkable. Other: None Musculoskeletal: No acute or significant osseous findings. IMPRESSION: Persistent and progressive small bowel obstruction with findings concerning for a closed loop obstruction and possible developing bowel ischemia. Surgical consult is advised. Electronically Signed   By: Elgie Collard M.D.   On: 02/11/2024 11:17   DG Abdomen 1 View Result Date: 02/10/2024 CLINICAL DATA:  Lower abdominal pain. EXAM: ABDOMEN - 1 VIEW COMPARISON:  CT abdomen pelvis from same day. Abdominal x-ray dated September 14, 2012. FINDINGS: Paucity of small bowel gas. Mildly distended stomach. Air and stool in the right colon. Excreted contrast in both renal collecting systems, ureters, and bladder. No acute osseous abnormality. IMPRESSION: 1. Nonspecific bowel gas pattern with paucity of small bowel gas. Electronically Signed   By: Obie Dredge M.D.   On: 02/10/2024 18:32   CT ABDOMEN PELVIS W CONTRAST Result Date: 02/10/2024 CLINICAL DATA:  Right lower quadrant abdominal pain. EXAM: CT ABDOMEN AND PELVIS WITH CONTRAST TECHNIQUE: Multidetector CT imaging of the abdomen and pelvis was performed using the standard protocol following bolus administration of intravenous contrast. RADIATION DOSE REDUCTION: This exam was performed according to the departmental dose-optimization program which includes automated  exposure control, adjustment of the mA and/or kV according to patient size and/or use of iterative reconstruction technique. CONTRAST:  OMNIPAQUE IOHEXOL 300 MG/ML  SOLN COMPARISON:  CT abdomen pelvis dated 03/09/2020. FINDINGS: Lower chest: Minimal bibasilar subpleural atelectasis. The visualized lung bases are otherwise clear. No intra-abdominal free air or free fluid. Hepatobiliary: The liver is unremarkable. No biliary dilatation. The gallbladder is unremarkable Pancreas: Unremarkable. No pancreatic ductal dilatation or surrounding inflammatory changes. Spleen: Normal in size without focal abnormality. Adrenals/Urinary Tract: The adrenal glands unremarkable. There is no hydronephrosis on either side. There is symmetric enhancement and excretion of contrast by both kidneys. The visualized ureters and urinary bladder appear unremarkable. Stomach/Bowel: There is mild dilatation of a short segment of small bowel in the anterior lower abdomen with fecalized content and mild edema of the associated mesentery. Findings may represent enteritis and ileus. Developing obstruction is not excluded. Small-bowel series may provide better evaluation. The colon is collapsed. The appendix is unremarkable. Vascular/Lymphatic: The abdominal aorta and IVC are unremarkable. No portal venous gas. There is no adenopathy. Reproductive: The prostate and seminal vesicles are grossly unremarkable. No pelvic mass. Other: None Musculoskeletal: No acute or significant osseous findings. IMPRESSION: 1. Short segment enteritis and ileus. Developing obstruction is not excluded. Small-bowel series may provide better evaluation. 2. Normal appendix. Electronically Signed   By: Elgie Collard M.D.   On: 02/10/2024 17:05   DG Finger Ring Right Result Date: 01/25/2024 CLINICAL DATA:  Right ring finger pain and swelling after jamming it yesterday. EXAM: RIGHT RING FINGER 2+V COMPARISON:  None Available. FINDINGS: There is no evidence of  fracture or dislocation. There is no evidence of arthropathy or other focal bone abnormality. Soft tissues are unremarkable. IMPRESSION: Negative. Electronically Signed   By: Obie Dredge M.D.   On: 01/25/2024 16:28    Microbiology: Results for orders placed or performed during the hospital encounter of 02/11/24  Surgical pcr screen     Status: None   Collection Time: 02/12/24  8:01 AM   Specimen: Nasal Mucosa; Nasal Swab  Result Value Ref Range Status   MRSA, PCR NEGATIVE NEGATIVE Final   Staphylococcus aureus NEGATIVE NEGATIVE Final    Comment: (NOTE) The Xpert SA Assay (FDA approved for NASAL specimens in patients 45 years of age and older), is one component of a comprehensive surveillance program. It is not intended to diagnose infection nor to guide or monitor treatment. Performed at [facility], [address] [place], [place] [address]     Labs: CBC: Recent Labs  Lab 02/10/24 1520 02/11/24 1026 02/11/24 1210 02/12/24 0401 02/13/24 0446  WBC 7.8 10.1  --  7.7 9.8  NEUTROABS 3.5  --   --   --   --   HGB 14.8 14.1 13.6 13.8 12.9*  HCT 43.7 42.9 40.0 42.4 40.1  MCV 90.7 92.3  --  92.8 93.5  PLT 382 335  --  287 253   Basic Metabolic Panel: Recent Labs  Lab 02/10/24 1520 02/11/24 1026 02/11/24 1210 02/11/24 1806 02/12/24 0401 02/13/24 0446 02/14/24 0442  NA 138 138 139  --  137 139 137  K 3.4* 4.1 3.9  --  4.0 3.8 3.4*  CL 100 102 103  --  106 104 104  CO2 24 26  --   --  25 27 25   GLUCOSE 123* 101* 93  --  118* 108* 101*  BUN 16 18 15   --  14 13 11   CREATININE 0.89 0.54* 1.00  --  0.72 0.85 0.84  CALCIUM 9.1 9.2  --   --  8.3* 8.5* 8.4*  MG  --   --   --  1.9 1.9 1.9  --   PHOS  --   --   --   --  2.4* 2.9  --    Liver Function Tests: Recent Labs  Lab 02/10/24 1520 02/11/24 1026 02/12/24 0401 02/13/24 0446 02/14/24 0442  AST 30 23 17 15 17   ALT 28 23 20 15 15   ALKPHOS 60 51 47 46 44  BILITOT 1.0 1.0 1.1 1.2 1.3*  PROT  7.9 7.2 6.6 6.4* 6.3*  ALBUMIN 4.4 4.1 3.7 3.3* 3.4*   CBG: Recent Labs  Lab 02/13/24 0547 02/13/24 1207 02/14/24 0001 02/14/24 0621 02/14/24 1142  GLUCAP 107* 89 83 91 77    Discharge time spent: greater than 30 minutes.  Signed: Lilia Pro, MD [facility] 02/14/2024

## 2024-02-14 NOTE — Plan of Care (Signed)
 Pt discharge to home independently accompanied by wife and son. Pt noted to be tolerating diet. No nausea and/or vomiting noted. Discharge education done at bedside. Pt verbalized understanding of follow up appointments and medication regimen. Vitals stable. IV removed.  Problem: Education: Goal: Knowledge of General Education information will improve Description: Including pain rating scale, medication(s)/side effects and non-pharmacologic comfort measures Outcome: Adequate for Discharge   Problem: Health Behavior/Discharge Planning: Goal: Ability to manage health-related needs will improve Outcome: Adequate for Discharge   Problem: Clinical Measurements: Goal: Ability to maintain clinical measurements within normal limits will improve Outcome: Adequate for Discharge Goal: Will remain free from infection Outcome: Adequate for Discharge Goal: Diagnostic test results will improve Outcome: Adequate for Discharge Goal: Respiratory complications will improve Outcome: Adequate for Discharge Goal: Cardiovascular complication will be avoided Outcome: Adequate for Discharge   Problem: Activity: Goal: Risk for activity intolerance will decrease Outcome: Adequate for Discharge   Problem: Nutrition: Goal: Adequate nutrition will be maintained Outcome: Adequate for Discharge   Problem: Coping: Goal: Level of anxiety will decrease Outcome: Adequate for Discharge   Problem: Elimination: Goal: Will not experience complications related to bowel motility Outcome: Adequate for Discharge Goal: Will not experience complications related to urinary retention Outcome: Adequate for Discharge   Problem: Pain Managment: Goal: General experience of comfort will improve and/or be controlled Outcome: Adequate for Discharge   Problem: Safety: Goal: Ability to remain free from injury will improve Outcome: Adequate for Discharge   Problem: Skin Integrity: Goal: Risk for impaired skin integrity will  decrease Outcome: Adequate for Discharge

## 2024-02-14 NOTE — Progress Notes (Signed)
 2 Days Post-Op   Subjective/Chief Complaint: Tolerated NG out Pain well controlled Having BM's   Objective: Vital signs in last 24 hours: Temp:  [98.2 F (36.8 C)-98.6 F (37 C)] 98.2 F (36.8 C) (03/23 0437) Pulse Rate:  [54-66] 54 (03/23 0437) Resp:  [16-18] 17 (03/23 0437) BP: (117-133)/(73-83) 117/73 (03/23 0437) SpO2:  [97 %-100 %] 97 % (03/23 0437) Last BM Date : 02/10/24  Intake/Output from previous day: 03/22 0701 - 03/23 0700 In: 318.1 [I.V.:218.1; IV Piggyback:100] Out: -  Intake/Output this shift: No intake/output data recorded.  Exam: Looks good on exam Abdomen soft, non-distended  Lab Results:  Recent Labs    02/12/24 0401 02/13/24 0446  WBC 7.7 9.8  HGB 13.8 12.9*  HCT 42.4 40.1  PLT 287 253   BMET Recent Labs    02/13/24 0446 02/14/24 0442  NA 139 137  K 3.8 3.4*  CL 104 104  CO2 27 25  GLUCOSE 108* 101*  BUN 13 11  CREATININE 0.85 0.84  CALCIUM 8.5* 8.4*   PT/INR No results for input(s): "LABPROT", "INR" in the last 72 hours. ABG No results for input(s): "PHART", "HCO3" in the last 72 hours.  Invalid input(s): "PCO2", "PO2"  Studies/Results: DG Abd Portable 1V Result Date: 02/12/2024 CLINICAL DATA:  Small bowel obstruction. EXAM: PORTABLE ABDOMEN - 1 VIEW COMPARISON:  One-view abdomen 02/12/2024 at 1:56 a.m. FINDINGS: Dilated loops of small bowel are again noted in the upper abdomen. Contrast previously seen in the stomach is now present in the small bowel near the transition site. NG tube is in the stomach. IMPRESSION: 1. Persistent small bowel obstruction. 2. Contrast previously seen in the stomach is now present in the small bowel near the transition site. Electronically Signed   By: Marin Roberts M.D.   On: 02/12/2024 12:09    Anti-infectives: Anti-infectives (From admission, onward)    Start     Dose/Rate Route Frequency Ordered Stop   02/12/24 0845  ceFAZolin (ANCEF) IVPB 2g/100 mL premix        2 g 200 mL/hr over  30 Minutes Intravenous On call 02/12/24 0758 02/12/24 1008       Assessment/Plan: s/p Procedure(s) with comments: LAPAROSCOPY, DIAGNOSTIC, ASSESSMENT OF PERFUSION USING ICG (N/A) - EXPLORATORY LAP, POSSIBLE SMALL BOWEL RESECTION LYSIS, ADHESIONS, LAPAROSCOPIC   Doing well post op Starting clears and advancing diet as tolerated Ok to discharge late today if tolerating at least fulls but I explained to him that he may be here until tomorrow   LOS: 3 days    Abigail Miyamoto MD 02/14/2024

## 2024-04-06 ENCOUNTER — Encounter: Payer: Self-pay | Admitting: Emergency Medicine

## 2024-04-06 ENCOUNTER — Ambulatory Visit: Admitting: Emergency Medicine

## 2024-04-06 VITALS — BP 110/68 | HR 59 | Temp 98.5°F | Ht 71.0 in | Wt 178.0 lb

## 2024-04-06 DIAGNOSIS — K56609 Unspecified intestinal obstruction, unspecified as to partial versus complete obstruction: Secondary | ICD-10-CM

## 2024-04-06 DIAGNOSIS — E785 Hyperlipidemia, unspecified: Secondary | ICD-10-CM | POA: Diagnosis not present

## 2024-04-06 NOTE — Assessment & Plan Note (Signed)
 Recently cleared by surgeon. Much improved without complications. No concerns identified

## 2024-04-06 NOTE — Assessment & Plan Note (Signed)
 Lab Results  Component Value Date   CHOL 170 01/06/2024   HDL 67.80 01/06/2024   LDLCALC 90 01/06/2024   TRIG 60.0 01/06/2024   CHOLHDL 3 01/06/2024  Chronic stable condition Cardiovascular risks associated with dyslipidemia discussed Continue rosuvastatin  10 mg daily

## 2024-04-06 NOTE — Progress Notes (Signed)
 Victor Nixon 58 y.o.   Chief Complaint  Patient presents with   post surgery follow up     Patient here for post surgery follow up, had LAPAROSCOPY, he states he was cleared by the surgeon. Patient also wanting to go over wanting a vasectomy     HISTORY OF PRESENT ILLNESS: This is a 58 y.o. 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  Overall doing well. Think about getting a vasectomy No other complaints or medical concerns today. Wt Readings from Last 3 Encounters:  04/06/24 178 lb (80.7 kg)  02/12/24 180 lb 12.4 oz (82 kg)  02/10/24 180 lb (81.6 kg)     HPI   Prior to Admission medications   Medication Sig Start Date End Date Taking? Authorizing Provider  Multiple Vitamin (MULTIVITAMIN) capsule Take 1 capsule by mouth at bedtime.   Yes [provider]  Omega-3 Fatty Acids (FISH OIL) 1200 MG CAPS Take 1,200 mg by mouth at bedtime.   Yes [provider]  rosuvastatin  (CRESTOR ) 10 MG tablet TAKE 1 TABLET DAILY 07/03/23  Yes Treyshawn Muldrew Jose, MD    Allergies  Allergen Reactions   Cephalexin Rash    Patient Active Problem List   Diagnosis Date Noted   SBO (small bowel obstruction) (HCC) 02/11/2024   HLD (hyperlipidemia) 04/24/2022   Family history of melanoma 02/18/2016    Past Medical History:  Diagnosis Date   Allergy    seasonal allergies   Broken arm (left arm) childhood   Heart murmur    Stable - not audible 5 yrs ago   History of kidney stones    Hyperlipidemia     Past Surgical History:  Procedure Laterality Date   COLONOSCOPY  2021   HERNIA REPAIR     age 42 or 8   INGUINAL HERNIA REPAIR Right 06/11/2022   Procedure: LAPAROSCOPIC RIGHT INGUINAL HERNIA;  Surgeon: Oza Blumenthal, MD;  Location: [facility];  Service: General;  Laterality: Right;   LAPAROSCOPIC LYSIS OF ADHESIONS  02/12/2024   Procedure: LYSIS, ADHESIONS,  LAPAROSCOPIC;  Surgeon: Aldean Hummingbird, MD;  Location: [facility];  Service: General;;   LAPAROSCOPY N/A 02/12/2024   Procedure: LAPAROSCOPY, DIAGNOSTIC, ASSESSMENT OF PERFUSION USING ICG;  Surgeon: Aldean Hummingbird, MD;  Location: [facility];  Service: General;  Laterality: N/A;  EXPLORATORY LAP, POSSIBLE SMALL BOWEL RESECTION   NEPHROLITHOTOMY  06/21/2012   Procedure: NEPHROLITHOTOMY PERCUTANEOUS;  Surgeon: Alanson Alliance, MD;  Location: [facility];  Service: Urology;  Laterality: Left;  left ureteral stent    Social History   Socioeconomic History   Marital status: Married    Spouse name: Not on file   Number of children: 1   Years of education: Not on file   Highest education level: Not on file  Occupational History   Occupation: sales  Tobacco Use   Smoking status: Never   Smokeless tobacco: Never  Vaping Use   Vaping status: Never Used  Substance and Sexual Activity   Alcohol use: Yes    Alcohol/week: 1.0 - 2.0 standard drink of alcohol    Types: 1 - 2 Glasses of wine per week    Comment: occassional beer, liquor   Drug use: No   Sexual activity: Yes    Partners: Female    Birth control/protection: Abstinence  Other Topics Concern   Not on file  Social History Narrative   Not on file   Social Drivers  of Health   Financial Resource Strain: Not on file  Food Insecurity: No Food Insecurity (02/13/2024)   Hunger Vital Sign    Worried About Running Out of Food in the Last Year: Never true    Ran Out of Food in the Last Year: Never true  Transportation Needs: No Transportation Needs (02/13/2024)   PRAPARE - Administrator, Civil Service (Medical): No    Lack of Transportation (Non-Medical): No  Physical Activity: Not on file  Stress: Not on file  Social Connections: Not on file  Intimate Partner Violence: Not At Risk (02/13/2024)   Humiliation, Afraid, Rape, and Kick questionnaire    Fear of Current or Ex-Partner: No    Emotionally Abused: No    Physically Abused: No     Sexually Abused: No    Family History  Problem Relation Age of Onset   Hip fracture Mother    Cancer Father        melanoma   Heart disease Father        heart valve replacement   Hyperlipidemia Father    Kidney Stones Son    Colon cancer Neg Hx    Esophageal cancer Neg Hx    Rectal cancer Neg Hx    Stomach cancer Neg Hx    Colon polyps Neg Hx      Review of Systems  Constitutional: Negative.  Negative for chills and fever.  HENT: Negative.  Negative for congestion and sore throat.   Respiratory: Negative.  Negative for cough and shortness of breath.   Cardiovascular: Negative.  Negative for chest pain and palpitations.  Gastrointestinal:  Negative for abdominal pain, diarrhea, nausea and vomiting.  Genitourinary: Negative.  Negative for dysuria and hematuria.  Skin: Negative.  Negative for rash.  Neurological: Negative.  Negative for dizziness and headaches.  All other systems reviewed and are negative.   Vitals:   04/06/24 1317  BP: 110/68  Pulse: (!) 59  Temp: 98.5 F (36.9 C)  SpO2: 99%    Physical Exam Constitutional:      Appearance: Normal appearance.  HENT:     Head: Normocephalic.  Eyes:     Extraocular Movements: Extraocular movements intact.  Cardiovascular:     Rate and Rhythm: Normal rate.  Pulmonary:     Effort: Pulmonary effort is normal.  Skin:    General: Skin is warm and dry.  Neurological:     Mental Status: He is alert and oriented to person, place, and time.  Psychiatric:        Behavior: Behavior normal.      ASSESSMENT & PLAN: A total of 33 minutes was spent with the patient and counseling/coordination of care regarding preparing for this visit, review of most recent office visit and surgical follow-up office visit notes, review of hospital discharge summary notes, review of most recent blood work results, diagnosis of dyslipidemia cardiovascular risks associated with this condition, review of health maintenance items, review of  the medications, education on nutrition, prognosis, documentation, and need for follow-up.  Problem List Items Addressed This Visit       Digestive   RESOLVED: SBO (small bowel obstruction) (HCC)   Recently cleared by surgeon. Much improved without complications. No concerns identified        Other   Dyslipidemia - Primary   Lab Results  Component Value Date   CHOL 170 01/06/2024   HDL 67.80 01/06/2024   LDLCALC 90 01/06/2024   TRIG 60.0 01/06/2024   CHOLHDL  3 01/06/2024  Chronic stable condition Cardiovascular risks associated with dyslipidemia discussed Continue rosuvastatin  10 mg daily       Patient Instructions  Health Maintenance, Male Adopting a healthy lifestyle and getting preventive care are important in promoting health and wellness. Ask your health care provider about: The right schedule for you to have regular tests and exams. Things you can do on your own to prevent diseases and keep yourself healthy. What should I know about diet, weight, and exercise? Eat a healthy diet  Eat a diet that includes plenty of vegetables, fruits, low-fat dairy products, and lean protein. Do not eat a lot of foods that are high in solid fats, added sugars, or sodium. Maintain a healthy weight Body mass index (BMI) is a measurement that can be used to identify possible weight problems. It estimates body fat based on height and weight. Your health care provider can help determine your BMI and help you achieve or maintain a healthy weight. Get regular exercise Get regular exercise. This is one of the most important things you can do for your health. Most adults should: Exercise for at least 150 minutes each week. The exercise should increase your heart rate and make you sweat (moderate-intensity exercise). Do strengthening exercises at least twice a week. This is in addition to the moderate-intensity exercise. Spend less time sitting. Even light physical activity can be  beneficial. Watch cholesterol and blood lipids Have your blood tested for lipids and cholesterol at 56 years of age, then have this test every 5 years. You may need to have your cholesterol levels checked more often if: Your lipid or cholesterol levels are high. You are older than 56 years of age. You are at high risk for heart disease. What should I know about cancer screening? Many types of cancers can be detected early and may often be prevented. Depending on your health history and family history, you may need to have cancer screening at various ages. This may include screening for: Colorectal cancer. Prostate cancer. Skin cancer. Lung cancer. What should I know about heart disease, diabetes, and high blood pressure? Blood pressure and heart disease High blood pressure causes heart disease and increases the risk of stroke. This is more likely to develop in people who have high blood pressure readings or are overweight. Talk with your health care provider about your target blood pressure readings. Have your blood pressure checked: Every 3-5 years if you are 56-61 years of age. Every year if you are 51 years old or older. If you are between the ages of 38 and 13 and are a current or former smoker, ask your health care provider if you should have a one-time screening for abdominal aortic aneurysm (AAA). Diabetes Have regular diabetes screenings. This checks your fasting blood sugar level. Have the screening done: Once every three years after age 67 if you are at a normal weight and have a low risk for diabetes. More often and at a younger age if you are overweight or have a high risk for diabetes. What should I know about preventing infection? Hepatitis B If you have a higher risk for hepatitis B, you should be screened for this virus. Talk with your health care provider to find out if you are at risk for hepatitis B infection. Hepatitis C Blood testing is recommended for: Everyone  born from [birth year] through 1963. Anyone with known risk factors for hepatitis C. Sexually transmitted infections (STIs) You should be screened each year for STIs,  including gonorrhea and chlamydia, if: You are sexually active and are younger than 56 years of age. You are older than 56 years of age and your health care provider tells you that you are at risk for this type of infection. Your sexual activity has changed since you were last screened, and you are at increased risk for chlamydia or gonorrhea. Ask your health care provider if you are at risk. Ask your health care provider about whether you are at high risk for HIV. Your health care provider may recommend a prescription medicine to help prevent HIV infection. If you choose to take medicine to prevent HIV, you should first get tested for HIV. You should then be tested every 3 months for as long as you are taking the medicine. Follow these instructions at home: Alcohol use Do not drink alcohol if your health care provider tells you not to drink. If you drink alcohol: Limit how much you have to 0-2 drinks a day. Know how much alcohol is in your drink. In the [place] one drink equals one 12 oz bottle of beer (355 mL), one 5 oz glass of wine (148 mL), or one 1 oz glass of hard liquor (44 mL). Lifestyle Do not use any products that contain nicotine or tobacco. These products include cigarettes, chewing tobacco, and vaping devices, such as e-cigarettes. If you need help quitting, ask your health care provider. Do not use street drugs. Do not share needles. Ask your health care provider for help if you need support or information about quitting drugs. General instructions Schedule regular health, dental, and eye exams. Stay current with your vaccines. Tell your health care provider if: You often feel depressed. You have ever been abused or do not feel safe at home. Summary Adopting a healthy lifestyle and getting preventive care are important  in promoting health and wellness. Follow your health care provider's instructions about healthy diet, exercising, and getting tested or screened for diseases. Follow your health care provider's instructions on monitoring your cholesterol and blood pressure. This information is not intended to replace advice given to you by your health care provider. Make sure you discuss any questions you have with your health care provider. Document Revised: 04/01/2021 Document Reviewed: 04/01/2021 Elsevier Patient Education  2024 [employer] Inc.    Maryagnes Small, MD [facility]

## 2024-04-06 NOTE — Patient Instructions (Signed)
 Health Maintenance, Male  Adopting a healthy lifestyle and getting preventive care are important in promoting health and wellness. Ask your health care provider about:  The right schedule for you to have regular tests and exams.  Things you can do on your own to prevent diseases and keep yourself healthy.  What should I know about diet, weight, and exercise?  Eat a healthy diet    Eat a diet that includes plenty of vegetables, fruits, low-fat dairy products, and lean protein.  Do not eat a lot of foods that are high in solid fats, added sugars, or sodium.  Maintain a healthy weight  Body mass index (BMI) is a measurement that can be used to identify possible weight problems. It estimates body fat based on height and weight. Your health care provider can help determine your BMI and help you achieve or maintain a healthy weight.  Get regular exercise  Get regular exercise. This is one of the most important things you can do for your health. Most adults should:  Exercise for at least 150 minutes each week. The exercise should increase your heart rate and make you sweat (moderate-intensity exercise).  Do strengthening exercises at least twice a week. This is in addition to the moderate-intensity exercise.  Spend less time sitting. Even light physical activity can be beneficial.  Watch cholesterol and blood lipids  Have your blood tested for lipids and cholesterol at 56 years of age, then have this test every 5 years.  You may need to have your cholesterol levels checked more often if:  Your lipid or cholesterol levels are high.  You are older than 56 years of age.  You are at high risk for heart disease.  What should I know about cancer screening?  Many types of cancers can be detected early and may often be prevented. Depending on your health history and family history, you may need to have cancer screening at various ages. This may include screening for:  Colorectal cancer.  Prostate cancer.  Skin cancer.  Lung  cancer.  What should I know about heart disease, diabetes, and high blood pressure?  Blood pressure and heart disease  High blood pressure causes heart disease and increases the risk of stroke. This is more likely to develop in people who have high blood pressure readings or are overweight.  Talk with your health care provider about your target blood pressure readings.  Have your blood pressure checked:  Every 3-5 years if you are 9-95 years of age.  Every year if you are 85 years old or older.  If you are between the ages of 29 and 29 and are a current or former smoker, ask your health care provider if you should have a one-time screening for abdominal aortic aneurysm (AAA).  Diabetes  Have regular diabetes screenings. This checks your fasting blood sugar level. Have the screening done:  Once every three years after age 23 if you are at a normal weight and have a low risk for diabetes.  More often and at a younger age if you are overweight or have a high risk for diabetes.  What should I know about preventing infection?  Hepatitis B  If you have a higher risk for hepatitis B, you should be screened for this virus. Talk with your health care provider to find out if you are at risk for hepatitis B infection.  Hepatitis C  Blood testing is recommended for:  Everyone born from [birth year] through 1963.  Anyone  with known risk factors for hepatitis C.  Sexually transmitted infections (STIs)  You should be screened each year for STIs, including gonorrhea and chlamydia, if:  You are sexually active and are younger than 56 years of age.  You are older than 56 years of age and your health care provider tells you that you are at risk for this type of infection.  Your sexual activity has changed since you were last screened, and you are at increased risk for chlamydia or gonorrhea. Ask your health care provider if you are at risk.  Ask your health care provider about whether you are at high risk for HIV. Your health care provider  may recommend a prescription medicine to help prevent HIV infection. If you choose to take medicine to prevent HIV, you should first get tested for HIV. You should then be tested every 3 months for as long as you are taking the medicine.  Follow these instructions at home:  Alcohol use  Do not drink alcohol if your health care provider tells you not to drink.  If you drink alcohol:  Limit how much you have to 0-2 drinks a day.  Know how much alcohol is in your drink. In the [place] one drink equals one 12 oz bottle of beer (355 mL), one 5 oz glass of wine (148 mL), or one 1 oz glass of hard liquor (44 mL).  Lifestyle  Do not use any products that contain nicotine or tobacco. These products include cigarettes, chewing tobacco, and vaping devices, such as e-cigarettes. If you need help quitting, ask your health care provider.  Do not use street drugs.  Do not share needles.  Ask your health care provider for help if you need support or information about quitting drugs.  General instructions  Schedule regular health, dental, and eye exams.  Stay current with your vaccines.  Tell your health care provider if:  You often feel depressed.  You have ever been abused or do not feel safe at home.  Summary  Adopting a healthy lifestyle and getting preventive care are important in promoting health and wellness.  Follow your health care provider's instructions about healthy diet, exercising, and getting tested or screened for diseases.  Follow your health care provider's instructions on monitoring your cholesterol and blood pressure.  This information is not intended to replace advice given to you by your health care provider. Make sure you discuss any questions you have with your health care provider.  Document Revised: 04/01/2021 Document Reviewed: 04/01/2021  Elsevier Patient Education  2024 [employer].

## 2024-06-27 ENCOUNTER — Other Ambulatory Visit: Payer: Self-pay | Admitting: Emergency Medicine

## 2024-06-27 DIAGNOSIS — E785 Hyperlipidemia, unspecified: Secondary | ICD-10-CM

## 2025-01-10 ENCOUNTER — Encounter: Payer: Managed Care, Other (non HMO) | Admitting: Emergency Medicine
